# Patient Record
Sex: Female | Born: 1985 | Race: Black or African American | Hispanic: No | Marital: Single | Smoking: Never smoker
Health system: Southern US, Community
[De-identification: ages and names within clinical notes are randomized; demographics above are authoritative.]

## PROBLEM LIST (undated history)

## (undated) DIAGNOSIS — D649 Anemia, unspecified: Secondary | ICD-10-CM

## (undated) DIAGNOSIS — I10 Essential (primary) hypertension: Secondary | ICD-10-CM

## (undated) DIAGNOSIS — F419 Anxiety disorder, unspecified: Secondary | ICD-10-CM

## (undated) HISTORY — PX: APPENDECTOMY: SHX54

---

## 2006-01-05 ENCOUNTER — Emergency Department (HOSPITAL_COMMUNITY): Admission: EM | Admit: 2006-01-05 | Discharge: 2006-01-06 | Payer: Self-pay | Admitting: Emergency Medicine

## 2007-09-08 ENCOUNTER — Ambulatory Visit (HOSPITAL_COMMUNITY): Admission: RE | Admit: 2007-09-08 | Discharge: 2007-09-08 | Payer: Self-pay | Admitting: Obstetrics

## 2007-10-26 ENCOUNTER — Ambulatory Visit (HOSPITAL_COMMUNITY): Admission: RE | Admit: 2007-10-26 | Discharge: 2007-10-26 | Payer: Self-pay | Admitting: Obstetrics

## 2007-10-28 ENCOUNTER — Inpatient Hospital Stay (HOSPITAL_COMMUNITY): Admission: AD | Admit: 2007-10-28 | Discharge: 2007-10-28 | Payer: Self-pay | Admitting: Obstetrics & Gynecology

## 2007-12-14 ENCOUNTER — Inpatient Hospital Stay (HOSPITAL_COMMUNITY): Admission: AD | Admit: 2007-12-14 | Discharge: 2007-12-14 | Payer: Self-pay | Admitting: Obstetrics

## 2007-12-15 ENCOUNTER — Ambulatory Visit (HOSPITAL_COMMUNITY): Admission: RE | Admit: 2007-12-15 | Discharge: 2007-12-15 | Payer: Self-pay | Admitting: Obstetrics

## 2008-01-31 ENCOUNTER — Inpatient Hospital Stay (HOSPITAL_COMMUNITY): Admission: AD | Admit: 2008-01-31 | Discharge: 2008-01-31 | Payer: Self-pay | Admitting: Obstetrics & Gynecology

## 2008-02-01 ENCOUNTER — Inpatient Hospital Stay (HOSPITAL_COMMUNITY): Admission: AD | Admit: 2008-02-01 | Discharge: 2008-02-01 | Payer: Self-pay | Admitting: Obstetrics

## 2008-02-19 ENCOUNTER — Inpatient Hospital Stay (HOSPITAL_COMMUNITY): Admission: AD | Admit: 2008-02-19 | Discharge: 2008-02-19 | Payer: Self-pay | Admitting: Obstetrics

## 2008-03-02 ENCOUNTER — Inpatient Hospital Stay (HOSPITAL_COMMUNITY): Admission: AD | Admit: 2008-03-02 | Discharge: 2008-03-06 | Payer: Self-pay | Admitting: Obstetrics

## 2008-05-02 ENCOUNTER — Emergency Department (HOSPITAL_COMMUNITY): Admission: EM | Admit: 2008-05-02 | Discharge: 2008-05-02 | Payer: Self-pay | Admitting: Emergency Medicine

## 2008-05-03 ENCOUNTER — Ambulatory Visit (HOSPITAL_COMMUNITY): Admission: RE | Admit: 2008-05-03 | Discharge: 2008-05-03 | Payer: Self-pay | Admitting: Emergency Medicine

## 2008-05-07 ENCOUNTER — Emergency Department (HOSPITAL_COMMUNITY): Admission: EM | Admit: 2008-05-07 | Discharge: 2008-05-07 | Payer: Self-pay | Admitting: *Deleted

## 2009-05-01 ENCOUNTER — Emergency Department (HOSPITAL_COMMUNITY): Admission: EM | Admit: 2009-05-01 | Discharge: 2009-05-01 | Payer: Self-pay | Admitting: Emergency Medicine

## 2010-03-23 ENCOUNTER — Encounter: Payer: Self-pay | Admitting: Internal Medicine

## 2010-04-14 ENCOUNTER — Encounter: Payer: Self-pay | Admitting: Internal Medicine

## 2010-04-14 LAB — CONVERTED CEMR LAB
ALT: 9 units/L
AST: 18 units/L
Alkaline Phosphatase: 70 units/L
BUN: 8 mg/dL
Basophils Relative: 0 %
CO2: 24 meq/L
Chloride: 102 meq/L
Creatinine, Ser: 0.78 mg/dL
Eosinophils Relative: 3 %
Glucose, Bld: 72 mg/dL
HCT: 29.2 %
Lymphocytes, automated: 30 %
MCV: 72.3 fL
Monocytes Relative: 9 %
Platelets: 328 10*3/uL
Potassium: 3.4 meq/L
TSH: 1.16 microintl units/mL
Total Bilirubin: 0.2 mg/dL
Total Protein: 6.8 g/dL
WBC: 8.4 10*3/uL

## 2010-05-07 DIAGNOSIS — I10 Essential (primary) hypertension: Secondary | ICD-10-CM | POA: Insufficient documentation

## 2010-05-07 DIAGNOSIS — D649 Anemia, unspecified: Secondary | ICD-10-CM | POA: Insufficient documentation

## 2010-05-08 ENCOUNTER — Ambulatory Visit (INDEPENDENT_AMBULATORY_CARE_PROVIDER_SITE_OTHER): Payer: BC Managed Care – PPO | Admitting: Internal Medicine

## 2010-05-08 ENCOUNTER — Encounter: Payer: Self-pay | Admitting: Internal Medicine

## 2010-05-08 ENCOUNTER — Encounter (INDEPENDENT_AMBULATORY_CARE_PROVIDER_SITE_OTHER): Payer: Self-pay | Admitting: *Deleted

## 2010-05-08 DIAGNOSIS — R519 Headache, unspecified: Secondary | ICD-10-CM | POA: Insufficient documentation

## 2010-05-08 DIAGNOSIS — I10 Essential (primary) hypertension: Secondary | ICD-10-CM

## 2010-05-08 DIAGNOSIS — R51 Headache: Secondary | ICD-10-CM | POA: Insufficient documentation

## 2010-05-08 DIAGNOSIS — D649 Anemia, unspecified: Secondary | ICD-10-CM

## 2010-05-08 DIAGNOSIS — Z9189 Other specified personal risk factors, not elsewhere classified: Secondary | ICD-10-CM | POA: Insufficient documentation

## 2010-05-14 NOTE — Assessment & Plan Note (Signed)
Summary: NEW [employer] PT  #  PKG  STC   Vital Signs:  Patient profile:   25 year old female Height:      65 inches (165.10 cm) Weight:      146.4 pounds (66.55 kg) BMI:     24.45 O2 Sat:      98 % on Room air Temp:     99.0 degrees F (37.22 degrees C) oral Pulse rate:   78 / minute BP sitting:   140 / 100  (left arm) Cuff size:   regular  Vitals Entered By: Orlan Leavens RMA (May 08, 2010 9:42 AM)  O2 Flow:  Room air CC: New patient Is Patient Diabetic? No Pain Assessment Patient in pain? no        Primary Care Provider:  Newt Lukes MD  CC:  New patient.  History of Present Illness: new to me and our prcatice, here to est care  c/o uncontrolled HTN -  reports variable compliance with medical treatment due to fatigue and light headed symptoms; no rx'd changes in medication dose or frequency.  no edema, CP or HA - hx preeclampsia 2009 - still anticipating future preg but on depoprov now - BP exac by stress - better control when away from work - has tried to control bp with low Na diet and regular exercise -   anemia - felt to be iron defic due to heavy menses - takes prenatal mvi for same - no other bleeding or bruising  Preventive Screening-Counseling & Management  Alcohol-Tobacco     Smoking Status: never  Clinical Review Panels:  Prevention   Last Pap Smear:  Interpretation Result:Negative for intraepithelial Lesion or Malignancy.    (03/23/2010)  CBC   WBC:  8.4 (04/14/2010)   RBC:  4.04 (04/14/2010)   Hgb:  9.0 (04/14/2010)   Hct:  29.2 (04/14/2010)   Platelets:  328 (04/14/2010)   MCV  72.3 (04/14/2010)   RDW  15.5 (04/14/2010)   PMN:  57 (04/14/2010)   Monos:  9 (04/14/2010)   Eosinophils:  3 (04/14/2010)   Basophil:  0 (04/14/2010)  Complete Metabolic Panel   Glucose:  72 (04/14/2010)   Sodium:  135 (04/14/2010)   Potassium:  3.4 (04/14/2010)   Chloride:  102 (04/14/2010)   CO2:  24 (04/14/2010)   BUN:  8 (04/14/2010)   Creatinine:   0.78 (04/14/2010)   Albumin:  4.1 (04/14/2010)   Total Protein:  6.8 (04/14/2010)   Calcium:  9.0 (04/14/2010)   Total Bili:  0.2 (04/14/2010)   Alk Phos:  70 (04/14/2010)   SGPT (ALT):  9 (04/14/2010)   SGOT (AST):  18 (04/14/2010)   -  Date:  04/14/2010    WBC: 8.4    HGB: 9.0    HCT: 29.2    RBC: 4.04    PLT: 328    MCV: 72.3    RDW: 15.5    Neutrophil: 57    Lymphs: 30    Monos: 9    Eos: 3    Basophil: 0    BG Random: 72    BUN: 8    Creatinine: 0.78    Sodium: 135    Potassium: 3.4    Chloride: 102    CO2 Total: 24    SGOT (AST): 18    SGPT (ALT): 9    T. Bilirubin: 0.2    Alk Phos: 70    Calcium: 9.0    Total Protein: 6.8    Albumin:  4.1    TSH: 1.160  Current Medications (verified): 1)  Prenate Plus 27-1 Mg Tabs (Prenatal Vit-Fe Fumarate-Fa) .... Take 1 By Mouth Once Daily 2)  Carvedilol 6.25 Mg Tabs (Carvedilol) .... Take 1 Two Times A Day 3)  Hydrochlorothiazide 25 Mg Tabs (Hydrochlorothiazide) .... 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    Physical Exam  General:  alert, well-developed, well-nourished, and cooperative to examination.   fiance at side Eyes:  vision grossly  intact; pupils equal, round and reactive to light.  conjunctiva and lids normal.    Ears:  R ear normal and L ear normal.   Mouth:  teeth and gums in good repair; mucous membranes moist, without lesions or ulcers. oropharynx clear without exudate, no erythema.  Neck:  supple, full ROM, no masses, no thyromegaly; no thyroid nodules or tenderness. no JVD or carotid bruits.   Lungs:  normal respiratory effort, no intercostal retractions or use of accessory muscles; normal breath sounds bilaterally - no crackles and no wheezes.    Heart:  normal rate, regular rhythm, no murmur, and no rub. BLE without edema. normal DP pulses and normal cap refill in all 4 extremities    Abdomen:  soft, non-tender, normal bowel sounds, no distention; no masses and no appreciable hepatomegaly or splenomegaly.  no bruits Neurologic:  alert & oriented X3 and cranial nerves II-XII symetrically intact.  strength normal in all extremities, sensation intact to light touch, and gait normal. speech fluent without dysarthria or aphasia; follows commands with good comprehension.  Psych:  Oriented X3, memory intact for recent and remote, normally interactive, good eye contact, not anxious appearing, not depressed appearing, and not agitated.      Impression & Recommendations:  Problem # 1:  HYPERTENSION (ICD-401.9)  adjust current med dosing to control BP but avoid adv Se (fatigue,?overtx) - new erx done recent labs from gyn reviewed - norm lytes and Cr - reck 2-4 wk to review BP and titrate as needed encouraged to cont low Na and exercise for optimal weight and control of same The following medications were removed from the medication list:    Labetalol Hcl 100 Mg Tabs (Labetalol hcl) .Marland Kitchen... Take 1 two times a day Her updated medication list for this problem includes:    Carvedilol 3.125 Mg Tabs (Carvedilol) .Marland Kitchen... 1 by mouth two times a day    Hydrochlorothiazide 12.5 Mg Tabs (Hydrochlorothiazide) .Marland Kitchen... 1 by mouth once  daily  BP today: 140/100  Labs Reviewed: K+: 3.4 (04/14/2010) Creat: : 0.78 (04/14/2010)     Orders: Prescription Created Electronically [phone])  Problem # 2:  ANEMIA-NOS (ICD-285.9)  Hgb: 9.0 (04/14/2010)   Hct: 29.2 (04/14/2010)   Platelets: 328 (04/14/2010) RBC: 4.04 (04/14/2010)   RDW: 15.5 (04/14/2010)   WBC: 8.4 (04/14/2010) MCV: 72.3 (04/14/2010)  TSH: 1.160 (04/14/2010)  Complete Medication List: 1)  Prenate Plus 27-1 Mg Tabs (Prenatal vit-fe fumarate-fa) .... Take 1 by mouth once daily 2)  Carvedilol 3.125 Mg Tabs (Carvedilol) .Marland Kitchen.. 1 by mouth two times a day 3)  Hydrochlorothiazide 12.5 Mg Tabs (Hydrochlorothiazide) .Marland Kitchen.. 1 by mouth once daily 4)  Depo-provera 150 Mg/ml Susp (Medroxyprogesterone acetate) .... Im every 3 mo at gynecology office  Patient Instructions: 1)  it was good to see you today. 2)  medications and history and recent lab tests reviewed 3)  we will continue blood pressure medications but at lower dose than before - see list below - your prescriptions have been electronically submitted to your pharmacy. Please take as directed. Contact our office if you believe you're having problems with the medication(s).  4)  Please schedule a follow-up appointment in 2-4 weeks to recheck blood pressure and titrate medications as needed, call sooner if problems.  5)  work note provided - medically ok to return tomorrow 05/09/10 Prescriptions: HYDROCHLOROTHIAZIDE 12.5 MG TABS (HYDROCHLOROTHIAZIDE) 1 by mouth once daily  #30 x 1   Entered and Authorized by:   Newt Lukes MD   Signed by:   Newt Lukes MD on 05/08/2010   Method used:   Electronically to        [facility] [phone]* (retail)       [address]       [place], [place]  [address]       Ph: [phone]       Fax: [phone]   RxID:   [id number] CARVEDILOL 3.125 MG TABS (CARVEDILOL) 1 by mouth two times a day  #60 x 1   Entered and Authorized by:   Newt Lukes MD   Signed  by:   Newt Lukes MD on 05/08/2010   Method used:   Electronically to        [facility] [phone]* (retail)       [address]       [place], [place]  [address]       Ph: [phone]       Fax: [phone]   RxID:   [id number]    Orders Added: 1)  New Patient Level III [29937] 2)  Prescription Created Electronically [[id number]]     Pap Smear  Procedure date:  03/23/2010  Findings:      Interpretation Result:Negative for intraepithelial Lesion or Malignancy.

## 2010-05-14 NOTE — Letter (Signed)
Summary: [facility]  [address] [place], [place] [address]   Phone: [phone]  Fax: [phone]    Today's Date: May 08, 2010  Name of Patient: Felicia Gibson  The above named patient had a medical visit today 05/08/10 Please take this into consideration when reviewing the time away from work. May return back normal schedule tomorrow 05/09/10  Special Instructions:    Sincerely yours,   Dr. Rene Paci

## 2010-05-26 ENCOUNTER — Ambulatory Visit: Payer: BC Managed Care – PPO | Admitting: Internal Medicine

## 2010-05-26 DIAGNOSIS — Z0289 Encounter for other administrative examinations: Secondary | ICD-10-CM

## 2010-07-08 LAB — CBC
MCHC: 33.5 g/dL (ref 30.0–36.0)
RDW: 15.4 % (ref 11.5–15.5)
WBC: 6.2 10*3/uL (ref 4.0–10.5)

## 2010-07-08 LAB — COMPREHENSIVE METABOLIC PANEL
ALT: 9 U/L (ref 0–35)
Albumin: 3.6 g/dL (ref 3.5–5.2)
Creatinine, Ser: 0.74 mg/dL (ref 0.4–1.2)
Sodium: 141 mEq/L (ref 135–145)
Total Bilirubin: 0.5 mg/dL (ref 0.3–1.2)
Total Protein: 5.9 g/dL — ABNORMAL LOW (ref 6.0–8.3)

## 2010-07-08 LAB — URINALYSIS, ROUTINE W REFLEX MICROSCOPIC
Bilirubin Urine: NEGATIVE
Glucose, UA: NEGATIVE mg/dL
Ketones, ur: NEGATIVE mg/dL
Nitrite: NEGATIVE
Specific Gravity, Urine: 1.008 (ref 1.005–1.030)
pH: 7 (ref 5.0–8.0)

## 2010-07-08 LAB — DIFFERENTIAL
Eosinophils Absolute: 0.1 10*3/uL (ref 0.0–0.7)
Lymphocytes Relative: 24 % (ref 12–46)
Neutrophils Relative %: 66 % (ref 43–77)

## 2010-07-08 LAB — LIPASE, BLOOD: Lipase: 19 U/L (ref 11–59)

## 2010-08-10 IMAGING — US US FETAL BPP W/O NONSTRESS
1 series · 14 of 14 positions shown · non-contrast
Comparison: none

OBSTETRICAL ULTRASOUND:
 This ultrasound exam was performed in the [HOSPITAL] Ultrasound Department.  The OB US report was generated in the AS system, and faxed to the ordering physician.  This report is also available in [REDACTED] PACS.

[Series 1: us fetal bpp w/o nonstress · 0.28mm/px · 14 acquisitions, 14 frames shown]
[im 1/14]
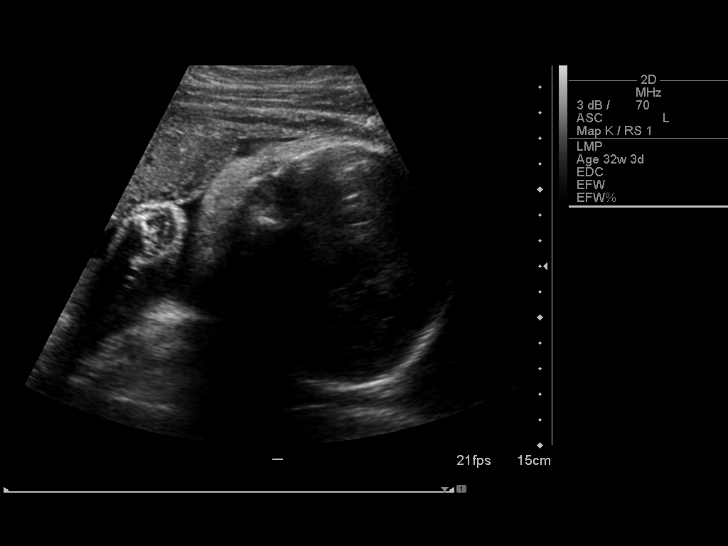
[im 2/14]
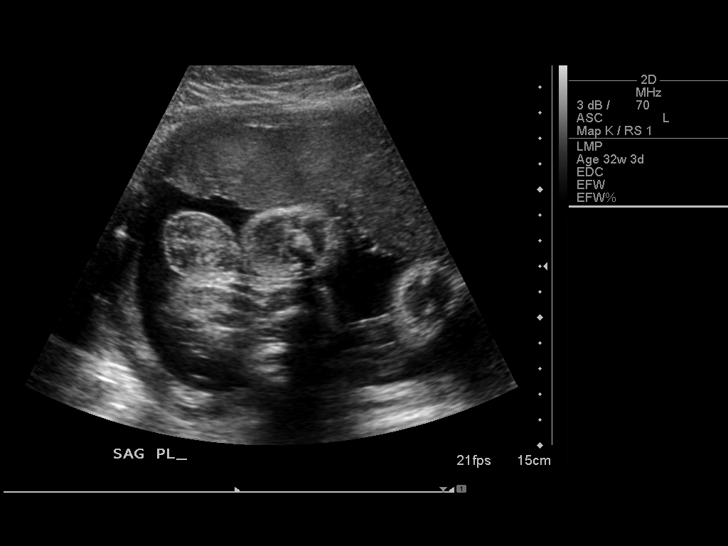
[im 3/14]
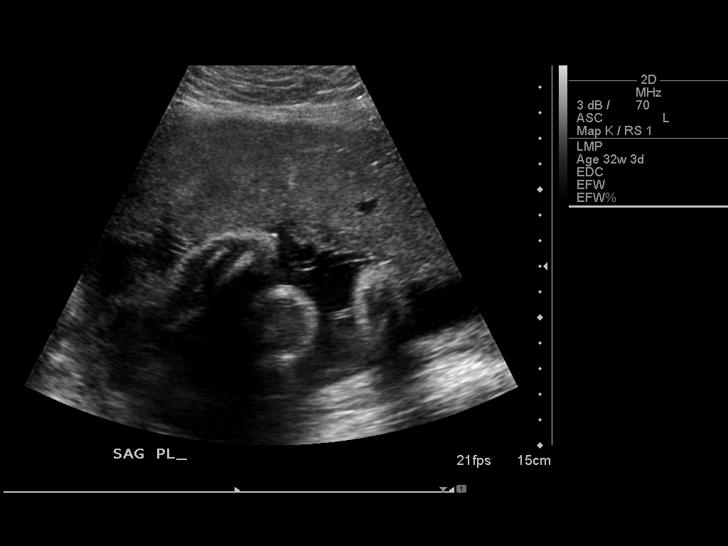
[im 4/14]
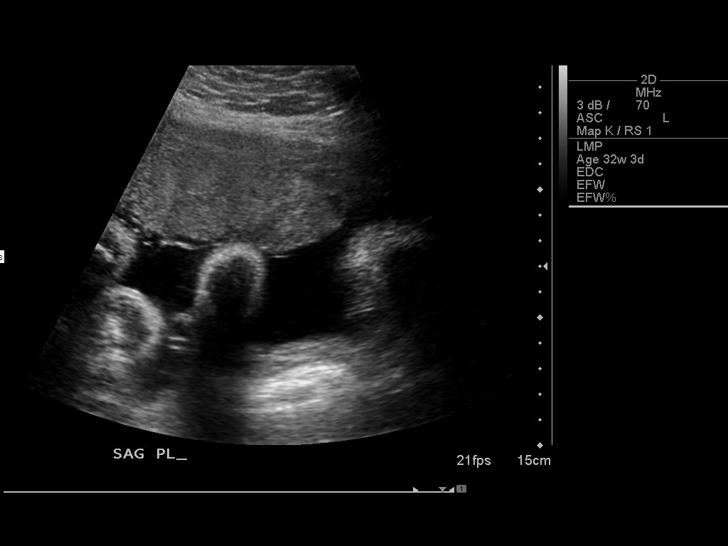
[im 5/14]
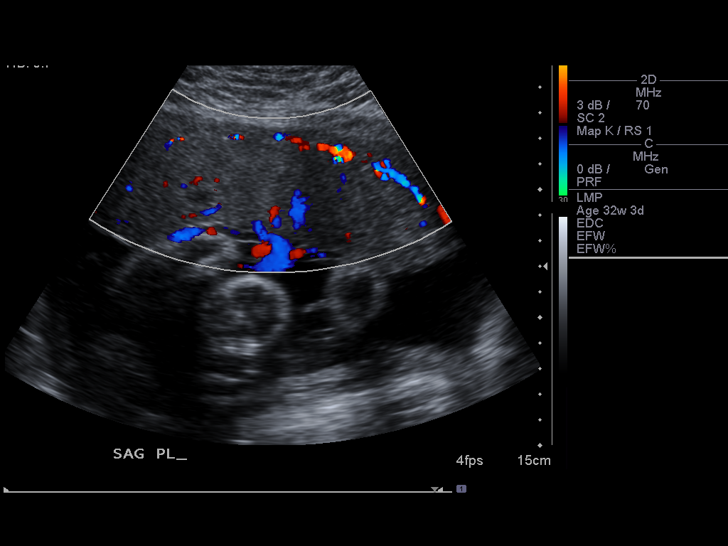
[im 6/14]
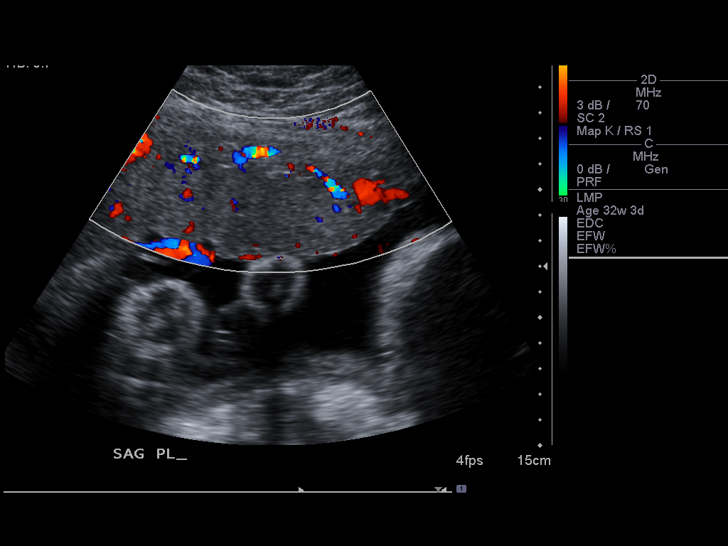
[im 7/14]
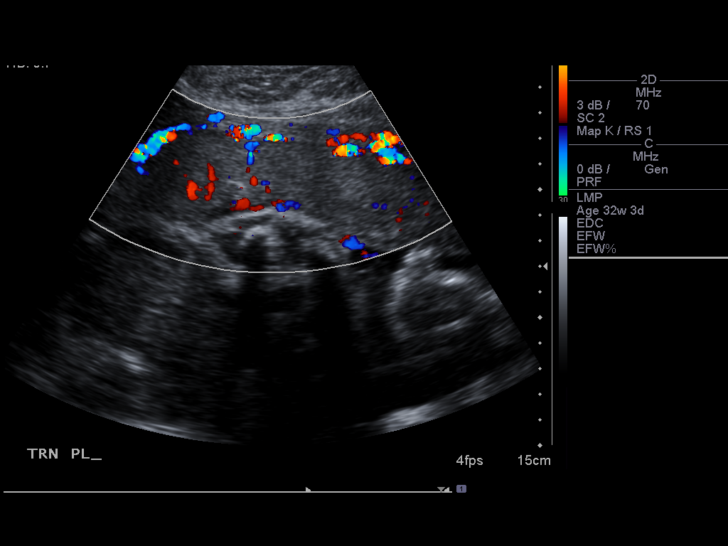
[im 8/14]
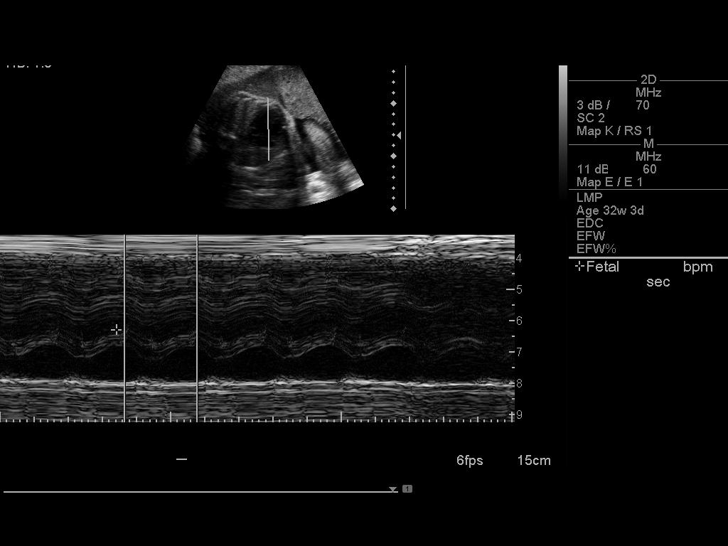
[im 9/14]
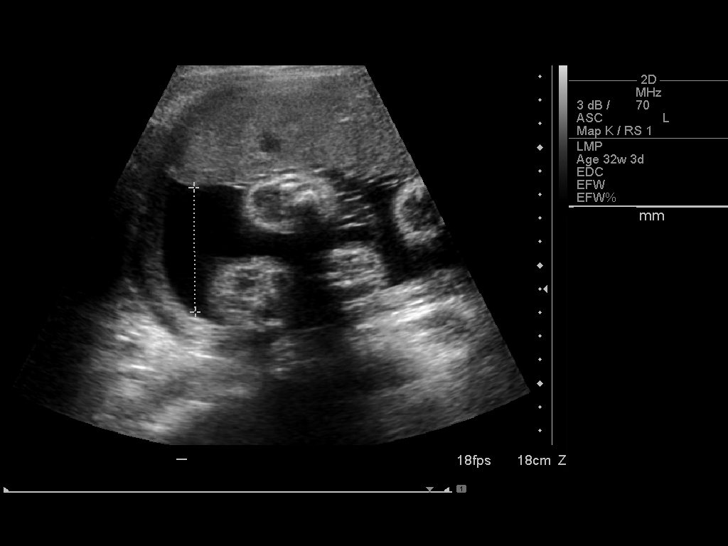
[im 10/14]
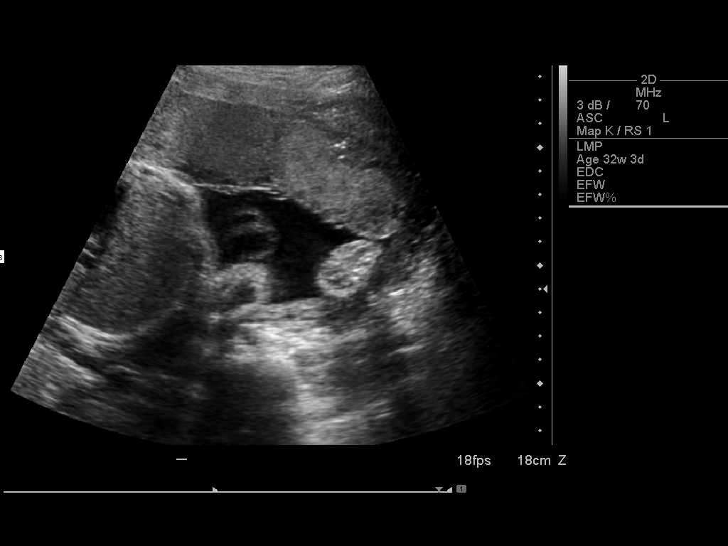
[im 11/14]
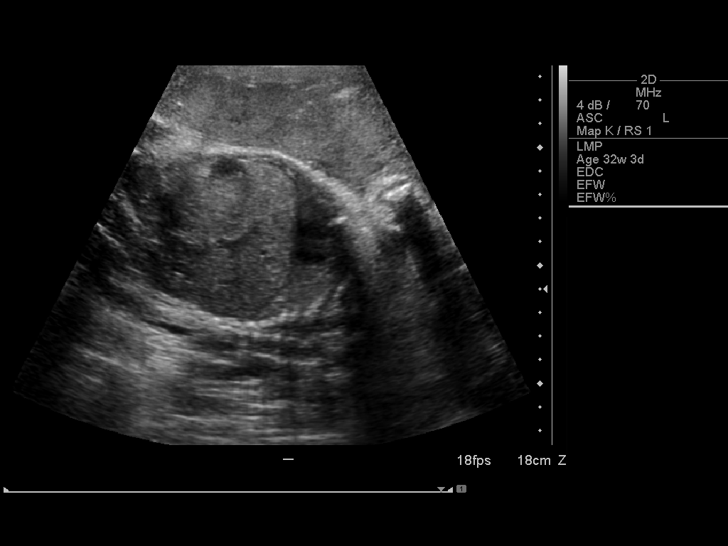
[im 12/14]
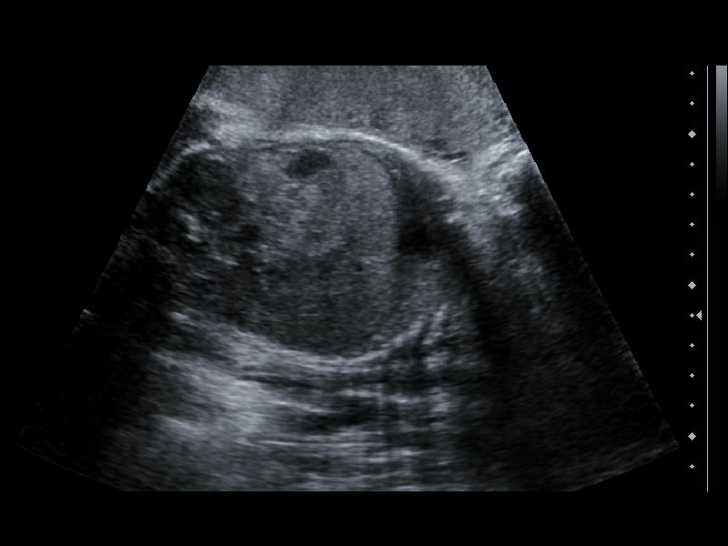
[im 13/14]
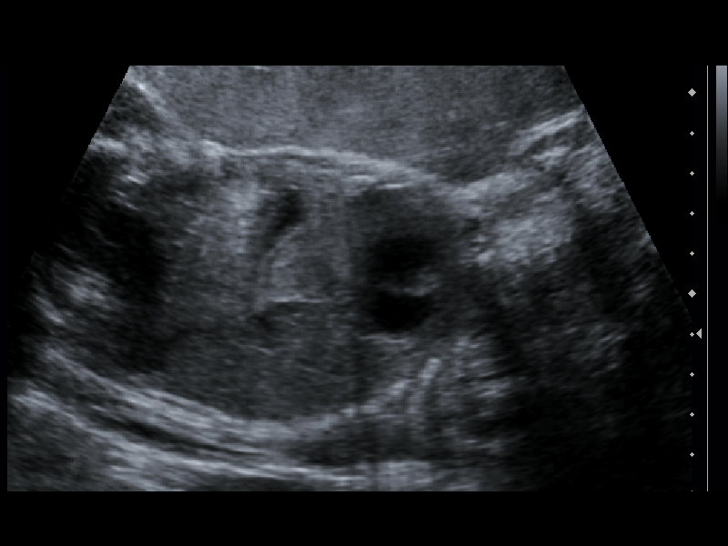
[im 14/14]
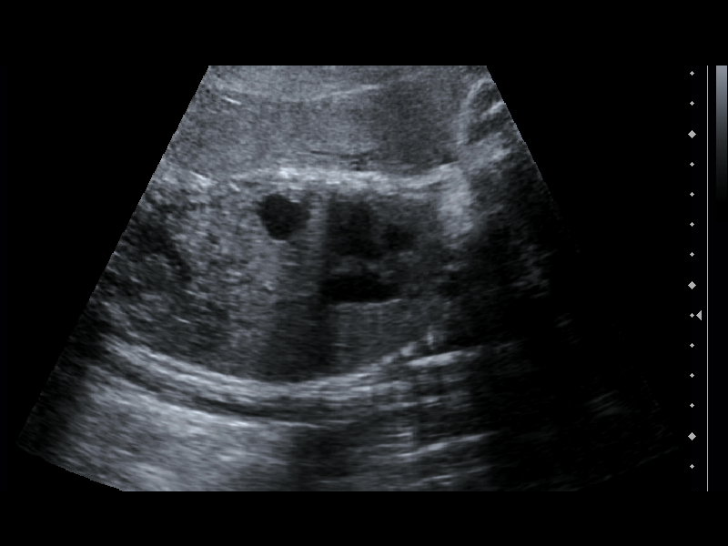

[14 of 14 positions shown; findings below may reference images not displayed]

IMPRESSION: See AS Obstetric US report.

## 2010-12-19 LAB — URINALYSIS, ROUTINE W REFLEX MICROSCOPIC
Bilirubin Urine: NEGATIVE
Hgb urine dipstick: NEGATIVE
Nitrite: NEGATIVE
Protein, ur: NEGATIVE
Urobilinogen, UA: 0.2
pH: 6

## 2010-12-19 LAB — WET PREP, GENITAL
Trich, Wet Prep: NONE SEEN
Yeast Wet Prep HPF POC: NONE SEEN

## 2010-12-19 LAB — CBC
MCV: 88.6
Platelets: 257
RDW: 13.3
WBC: 6.7

## 2010-12-19 LAB — ABO/RH: ABO/RH(D): O POS

## 2010-12-22 LAB — URINALYSIS, ROUTINE W REFLEX MICROSCOPIC
Ketones, ur: NEGATIVE
Protein, ur: NEGATIVE
pH: 7

## 2010-12-23 LAB — URINE MICROSCOPIC-ADD ON: RBC / HPF: NONE SEEN

## 2010-12-23 LAB — URINALYSIS, ROUTINE W REFLEX MICROSCOPIC
Glucose, UA: NEGATIVE
Protein, ur: NEGATIVE
Specific Gravity, Urine: <1.005 — ABNORMAL LOW
Urobilinogen, UA: 0.2

## 2010-12-26 LAB — URINALYSIS, ROUTINE W REFLEX MICROSCOPIC
Bilirubin Urine: NEGATIVE
Hgb urine dipstick: NEGATIVE
Ketones, ur: NEGATIVE mg/dL
Nitrite: NEGATIVE
Protein, ur: NEGATIVE mg/dL
Specific Gravity, Urine: <1.005 — ABNORMAL LOW (ref 1.005–1.030)
Urobilinogen, UA: 0.2 mg/dL (ref 0.0–1.0)

## 2010-12-26 LAB — URIC ACID: Uric Acid, Serum: 4.9 mg/dL (ref 2.4–7.0)

## 2010-12-26 LAB — COMPREHENSIVE METABOLIC PANEL
AST: 15 U/L (ref 0–37)
AST: 15 U/L (ref 0–37)
Albumin: 2.5 g/dL — ABNORMAL LOW (ref 3.5–5.2)
Albumin: 2.5 g/dL — ABNORMAL LOW (ref 3.5–5.2)
Alkaline Phosphatase: 116 U/L (ref 39–117)
Alkaline Phosphatase: 133 U/L — ABNORMAL HIGH (ref 39–117)
Alkaline Phosphatase: 95 U/L (ref 39–117)
BUN: 2 mg/dL — ABNORMAL LOW (ref 6–23)
BUN: 2 mg/dL — ABNORMAL LOW (ref 6–23)
CO2: 24 mEq/L (ref 19–32)
CO2: 25 mEq/L (ref 19–32)
Calcium: 8.4 mg/dL (ref 8.4–10.5)
Chloride: 106 mEq/L (ref 96–112)
Chloride: 107 mEq/L (ref 96–112)
Creatinine, Ser: 0.41 mg/dL (ref 0.4–1.2)
GFR calc Af Amer: >60 mL/min (ref >60)
GFR calc Af Amer: >60 mL/min (ref >60)
GFR calc Af Amer: >60 mL/min (ref >60)
GFR calc non Af Amer: >60 mL/min (ref >60)
GFR calc non Af Amer: >60 mL/min (ref >60)
Glucose, Bld: 81 mg/dL (ref 70–99)
Potassium: 3.4 mEq/L — ABNORMAL LOW (ref 3.5–5.1)
Potassium: 3.7 mEq/L (ref 3.5–5.1)
Potassium: 3.8 mEq/L (ref 3.5–5.1)
Sodium: 135 mEq/L (ref 135–145)
Sodium: 136 mEq/L (ref 135–145)
Total Bilirubin: 0.4 mg/dL (ref 0.3–1.2)
Total Bilirubin: 0.4 mg/dL (ref 0.3–1.2)
Total Protein: 4.2 g/dL — ABNORMAL LOW (ref 6.0–8.3)
Total Protein: 5.6 g/dL — ABNORMAL LOW (ref 6.0–8.3)
Total Protein: 5.7 g/dL — ABNORMAL LOW (ref 6.0–8.3)

## 2010-12-26 LAB — LACTATE DEHYDROGENASE
LDH: 139 U/L (ref 94–250)
LDH: 244 U/L (ref 94–250)

## 2010-12-26 LAB — CBC
HCT: 23.6 % — ABNORMAL LOW (ref 36.0–46.0)
HCT: 28.2 % — ABNORMAL LOW (ref 36.0–46.0)
HCT: 29.5 % — ABNORMAL LOW (ref 36.0–46.0)
Hemoglobin: 8.1 g/dL — ABNORMAL LOW (ref 12.0–15.0)
MCHC: 33.6 g/dL (ref 30.0–36.0)
MCHC: 33.7 g/dL (ref 30.0–36.0)
MCV: 84.7 fL (ref 78.0–100.0)
MCV: 84.9 fL (ref 78.0–100.0)
Platelets: 238 10*3/uL (ref 150–400)
Platelets: 248 10*3/uL (ref 150–400)
Platelets: 267 10*3/uL (ref 150–400)
RBC: 3.32 MIL/uL — ABNORMAL LOW (ref 3.87–5.11)
RBC: 3.51 MIL/uL — ABNORMAL LOW (ref 3.87–5.11)
RDW: 13.1 % (ref 11.5–15.5)
RDW: 13.3 % (ref 11.5–15.5)
RDW: 13.4 % (ref 11.5–15.5)
RDW: 13.6 % (ref 11.5–15.5)
WBC: 6.8 10*3/uL (ref 4.0–10.5)

## 2010-12-26 LAB — MAGNESIUM: Magnesium: 5.9 mg/dL — ABNORMAL HIGH (ref 1.5–2.5)

## 2010-12-26 LAB — RPR: RPR Ser Ql: NONREACTIVE

## 2014-12-25 ENCOUNTER — Emergency Department (HOSPITAL_COMMUNITY): Payer: Self-pay

## 2014-12-25 ENCOUNTER — Encounter (HOSPITAL_COMMUNITY): Payer: Self-pay | Admitting: Emergency Medicine

## 2014-12-25 ENCOUNTER — Emergency Department (HOSPITAL_COMMUNITY)
Admission: EM | Admit: 2014-12-25 | Discharge: 2014-12-25 | Disposition: A | Discharge status: Home / Self Care | Payer: Self-pay | Attending: Emergency Medicine | Admitting: Emergency Medicine

## 2014-12-25 DIAGNOSIS — Z3202 Encounter for pregnancy test, result negative: Secondary | ICD-10-CM | POA: Insufficient documentation

## 2014-12-25 DIAGNOSIS — I10 Essential (primary) hypertension: Secondary | ICD-10-CM | POA: Insufficient documentation

## 2014-12-25 DIAGNOSIS — Z8659 Personal history of other mental and behavioral disorders: Secondary | ICD-10-CM | POA: Insufficient documentation

## 2014-12-25 DIAGNOSIS — M79605 Pain in left leg: Secondary | ICD-10-CM | POA: Insufficient documentation

## 2014-12-25 DIAGNOSIS — Z862 Personal history of diseases of the blood and blood-forming organs and certain disorders involving the immune mechanism: Secondary | ICD-10-CM | POA: Insufficient documentation

## 2014-12-25 DIAGNOSIS — Z79899 Other long term (current) drug therapy: Secondary | ICD-10-CM | POA: Insufficient documentation

## 2014-12-25 DIAGNOSIS — R0789 Other chest pain: Secondary | ICD-10-CM

## 2014-12-25 DIAGNOSIS — R0602 Shortness of breath: Secondary | ICD-10-CM | POA: Insufficient documentation

## 2014-12-25 HISTORY — DX: Anxiety disorder, unspecified: F41.9

## 2014-12-25 HISTORY — DX: Essential (primary) hypertension: I10

## 2014-12-25 HISTORY — DX: Anemia, unspecified: D64.9

## 2014-12-25 LAB — BASIC METABOLIC PANEL
Anion gap: 5 (ref 5–15)
BUN: 11 mg/dL (ref 6–20)
CALCIUM: 9 mg/dL (ref 8.9–10.3)
CO2: 28 mmol/L (ref 22–32)
CREATININE: 0.76 mg/dL (ref 0.44–1.00)
Chloride: 101 mmol/L (ref 101–111)
GFR calc non Af Amer: >60 mL/min (ref >60)
Glucose, Bld: 94 mg/dL (ref 65–99)
Potassium: 3.7 mmol/L (ref 3.5–5.1)
SODIUM: 134 mmol/L — AB (ref 135–145)

## 2014-12-25 LAB — I-STAT TROPONIN, ED: Troponin i, poc: 0.01 ng/mL (ref 0.00–0.08)

## 2014-12-25 LAB — CBC
HCT: 36.2 % (ref 36.0–46.0)
Hemoglobin: 12.2 g/dL (ref 12.0–15.0)
MCH: 28.4 pg (ref 26.0–34.0)
MCHC: 33.7 g/dL (ref 30.0–36.0)
MCV: 84.2 fL (ref 78.0–100.0)
PLATELETS: 340 10*3/uL (ref 150–400)
RBC: 4.3 MIL/uL (ref 3.87–5.11)
RDW: 13.7 % (ref 11.5–15.5)
WBC: 6.9 10*3/uL (ref 4.0–10.5)

## 2014-12-25 LAB — I-STAT BETA HCG BLOOD, ED (MC, WL, AP ONLY)

## 2014-12-25 LAB — D-DIMER, QUANTITATIVE: D-Dimer, Quant: <0.27 ug{FEU}/mL (ref 0.00–0.48)

## 2014-12-25 MED ORDER — LORAZEPAM 2 MG/ML IJ SOLN
1.0000 mg | Freq: Once | INTRAMUSCULAR | Status: AC
  Administered 2014-12-25: 1 mg via INTRAMUSCULAR
  Filled 2014-12-25: qty 1

## 2014-12-25 MED ORDER — LORAZEPAM 2 MG/ML IJ SOLN
1.0000 mg | Freq: Once | INTRAMUSCULAR | Status: DC
Start: 2014-12-25 — End: 2014-12-25

## 2014-12-25 MED ORDER — IBUPROFEN 400 MG PO TABS: 400.0000 mg | ORAL_TABLET | Freq: Four times a day (QID) | ORAL | Status: DC | PRN

## 2014-12-25 MED ORDER — KETOROLAC TROMETHAMINE 30 MG/ML IJ SOLN: 30.0000 mg | Freq: Once | INTRAMUSCULAR | Status: DC

## 2014-12-25 MED ORDER — DIAZEPAM 2 MG PO TABS: 2.0000 mg | ORAL_TABLET | ORAL | Status: DC | PRN

## 2014-12-25 MED ORDER — KETOROLAC TROMETHAMINE 30 MG/ML IJ SOLN
30.0000 mg | Freq: Once | INTRAMUSCULAR | Status: AC
  Administered 2014-12-25: 30 mg via INTRAMUSCULAR
  Filled 2014-12-25: qty 1

## 2014-12-25 NOTE — ED Notes (Signed)
Pt c/o posterior left calf pain this past week radiating to left posterior thigh. Starting today has had mid sternal chest pain radiating to left shoulder and left neck. C/o SOB and continues to say, "My chest hurts so bad, it's just the weirdest pain." RR even/unlabored. Not on blood thinners. Denies hx pulmonary embolism. No other c/c. Denies N/V/D.

## 2014-12-25 NOTE — ED Provider Notes (Signed)
CSN: [id number]     Arrival date & time 12/25/14  1408 History   First MD Initiated Contact with Patient 12/25/14 1507     Chief Complaint  Patient presents with  . Chest Pain  . Shortness of Breath  . Leg Pain     (Consider location/radiation/quality/duration/timing/severity/associated sxs/prior Treatment) HPI Comments: Pt here with 3 days of left leg pain described as sharp and worse at the sciatic notch--pain worse with standing and does radiate down her leg--denies leg swelling  Also, notes sudden onset of left sided sharp chest pain pain that's been constant and worse with movement or deep breathing-- Denies fever, cough, trauma--no syncope or near syncope, no prior h/o same Pt given zofran and transported by ems  Patient is a 29 y.o. female presenting with chest pain, shortness of breath, and leg pain. The history is provided by the patient.  Chest Pain Associated symptoms: shortness of breath   Shortness of Breath Associated symptoms: chest pain   Leg Pain   Past Medical History  Diagnosis Date  . Hypertension   . Anemia   . Anxiety    Past Surgical History  Procedure Laterality Date  . Appendectomy     History reviewed. No pertinent family history. Social History  Substance Use Topics  . Smoking status: Never Smoker   . Smokeless tobacco: None  . Alcohol Use: No   OB History    No data available     Review of Systems  Respiratory: Positive for shortness of breath.   Cardiovascular: Positive for chest pain.  All other systems reviewed and are negative.     Allergies  Review of patient's allergies indicates no known allergies.  Home Medications   Prior to Admission medications   Medication Sig Start Date End Date Taking? Authorizing Provider  acetaminophen (TYLENOL) 500 MG tablet Take 1,000 mg by mouth every 6 (six) hours as needed for mild pain, moderate pain, fever or headache.   Yes Historical Provider, MD  carvedilol (COREG) 3.125 MG tablet  Take 3.125 mg by mouth 2 (two) times daily with a meal.   Yes Historical Provider, MD  hydrochlorothiazide (MICROZIDE) 12.5 MG capsule Take 12.5 mg by mouth daily.   Yes Historical Provider, MD   BP 133/104 mmHg  Pulse 75  Temp(Src) 98.5 F (36.9 C) (Oral)  Resp 12  SpO2 100%  LMP 12/11/2014 (Approximate) Physical Exam  Constitutional: She is oriented to person, place, and time. She appears well-developed and well-nourished.  Non-toxic appearance. No distress.  HENT:  Head: Normocephalic and atraumatic.  Eyes: Conjunctivae, EOM and lids are normal. Pupils are equal, round, and reactive to light.  Neck: Normal range of motion. Neck supple. No tracheal deviation present. No thyroid mass present.  Cardiovascular: Normal rate, regular rhythm and normal heart sounds.  Exam reveals no gallop.   No murmur heard. Pulmonary/Chest: Effort normal and breath sounds normal. No stridor. No respiratory distress. She has no decreased breath sounds. She has no wheezes. She has no rhonchi. She has no rales.  Abdominal: Soft. Normal appearance and bowel sounds are normal. She exhibits no distension. There is no tenderness. There is no rebound and no CVA tenderness.  Musculoskeletal: Normal range of motion. She exhibits no edema or tenderness.       Legs: Neurological: She is alert and oriented to person, place, and time. She has normal strength. No cranial nerve deficit or sensory deficit. GCS eye subscore is 4. GCS verbal subscore is 5. GCS motor subscore  is 6.  Skin: Skin is warm and dry. No abrasion and no rash noted.  Psychiatric: She has a normal mood and affect. Her speech is normal and behavior is normal.  Nursing note and vitals reviewed.   ED Course  Procedures (including critical care time) Labs Review Labs Reviewed  BASIC METABOLIC PANEL - Abnormal; Notable for the following:    Sodium 134 (*)    All other components within normal limits  CBC  D-DIMER, QUANTITATIVE (NOT AT [facility])  I-STAT  BETA HCG BLOOD, ED ([facility], [facility], [facility] ONLY)  I-STAT TROPOININ, ED    Imaging Review Dg Chest 2 View  12/25/2014   CLINICAL DATA:  Chest pain and shortness of breath today.  EXAM: CHEST  2 VIEW  COMPARISON:  None.  FINDINGS: The heart size and mediastinal contours are within normal limits. Both lungs are clear. The visualized skeletal structures are unremarkable.  IMPRESSION: No active cardiopulmonary disease.   Electronically Signed   By: Sherian Rein M.D.   On: 12/25/2014 14:46   I have personally reviewed and evaluated these images and lab results as part of my medical decision-making.   EKG Interpretation   Date/Time:  Tuesday December 25 2014 14:18:48 EDT Ventricular Rate:  70 PR Interval:  152 QRS Duration: 90 QT Interval:  403 QTC Calculation: 435 R Axis:   35 Text Interpretation:  Sinus rhythm Confirmed by Freida Busman  MD, Gaila Engebretsen ([id number])  on 12/25/2014 3:27:23 PM      MDM   Final diagnoses:  None   Patient given medications here musculoskeletal chest pain as well as likely sciatica feels better. No evidence of stroke at this time. Do not think this represents ACS or PE. Patient for discharge     Lorre Nick, MD 12/25/14 1739

## 2014-12-25 NOTE — ED Notes (Signed)
Per EMS-states patient was at work, eating lunch when she got central chest pain radiating to left arm, Zofran 4 mg given in route

## 2014-12-25 NOTE — ED Notes (Signed)
Pt c/o PIV "hurting her to the point my arm feels like something is wrong with it." PIV flushed with 10 cc NS. Appropriate blood return noted without tourniquet. PIV removed.

## 2014-12-25 NOTE — Discharge Instructions (Signed)
Sciatica °Sciatica is pain, weakness, numbness, or tingling along the path of the sciatic nerve. The nerve starts in the lower back and runs down the back of each leg. The nerve controls the muscles in the lower leg and in the back of the knee, while also providing sensation to the back of the thigh, lower leg, and the sole of your foot. Sciatica is a symptom of another medical condition. For instance, nerve damage or certain conditions, such as a herniated disk or bone spur on the spine, pinch or put pressure on the sciatic nerve. This causes the pain, weakness, or other sensations normally associated with sciatica. Generally, sciatica only affects one side of the body. °CAUSES  °· Herniated or slipped disc. °· Degenerative disk disease. °· A pain disorder involving the narrow muscle in the buttocks (piriformis syndrome). °· Pelvic injury or fracture. °· Pregnancy. °· Tumor (rare). °SYMPTOMS  °Symptoms can vary from mild to very severe. The symptoms usually travel from the low back to the buttocks and down the back of the leg. Symptoms can include: °· Mild tingling or dull aches in the lower back, leg, or hip. °· Numbness in the back of the calf or sole of the foot. °· Burning sensations in the lower back, leg, or hip. °· Sharp pains in the lower back, leg, or hip. °· Leg weakness. °· Severe back pain inhibiting movement. °These symptoms may get worse with coughing, sneezing, laughing, or prolonged sitting or standing. Also, being overweight may worsen symptoms. °DIAGNOSIS  °Your caregiver will perform a physical exam to look for common symptoms of sciatica. He or she may ask you to do certain movements or activities that would trigger sciatic nerve pain. Other tests may be performed to find the cause of the sciatica. These may include: °· Blood tests. °· X-rays. °· Imaging tests, such as an MRI or CT scan. °TREATMENT  °Treatment is directed at the cause of the sciatic pain. Sometimes, treatment is not necessary  and the pain and discomfort goes away on its own. If treatment is needed, your caregiver may suggest: °· Over-the-counter medicines to relieve pain. °· Prescription medicines, such as anti-inflammatory medicine, muscle relaxants, or narcotics. °· Applying heat or ice to the painful area. °· Steroid injections to lessen pain, irritation, and inflammation around the nerve. °· Reducing activity during periods of pain. °· Exercising and stretching to strengthen your abdomen and improve flexibility of your spine. Your caregiver may suggest losing weight if the extra weight makes the back pain worse. °· Physical therapy. °· Surgery to eliminate what is pressing or pinching the nerve, such as a bone spur or part of a herniated disk. °HOME CARE INSTRUCTIONS  °· Only take over-the-counter or prescription medicines for pain or discomfort as directed by your caregiver. °· Apply ice to the affected area for 20 minutes, 3-4 times a day for the first 48-72 hours. Then try heat in the same way. °· Exercise, stretch, or perform your usual activities if these do not aggravate your pain. °· Attend physical therapy sessions as directed by your caregiver. °· Keep all follow-up appointments as directed by your caregiver. °· Do not wear high heels or shoes that do not provide proper support. °· Check your mattress to see if it is too soft. A firm mattress may lessen your pain and discomfort. °SEEK IMMEDIATE MEDICAL CARE IF:  °· You lose control of your bowel or bladder (incontinence). °· You have increasing weakness in the lower back, pelvis, buttocks,   or legs.  You have redness or swelling of your back.  You have a burning sensation when you urinate.  You have pain that gets worse when you lie down or awakens you at night.  Your pain is worse than you have experienced in the past.  Your pain is lasting longer than 4 weeks.  You are suddenly losing weight without reason. MAKE SURE YOU:  Understand these  instructions.  Will watch your condition.  Will get help right away if you are not doing well or get worse. Document Released: 03/03/2001 Document Revised: 09/08/2011 Document Reviewed: 07/19/2011 [facility] Patient Information 2015 [place], [place]. This information is not intended to replace advice given to you by your health care provider. Make sure you discuss any questions you have with your health care provider. Nonspecific Chest Pain  Chest pain can be caused by many different conditions. There is always a chance that your pain could be related to something serious, such as a heart attack or a blood clot in your lungs. Chest pain can also be caused by conditions that are not life-threatening. If you have chest pain, it is very important to follow up with your health care provider. CAUSES  Chest pain can be caused by:  Heartburn.  Pneumonia or bronchitis.  Anxiety or stress.  Inflammation around your heart (pericarditis) or lung (pleuritis or pleurisy).  A blood clot in your lung.  A collapsed lung (pneumothorax). It can develop suddenly on its own (spontaneous pneumothorax) or from trauma to the chest.  Shingles infection (varicella-zoster virus).  Heart attack.  Damage to the bones, muscles, and cartilage that make up your chest wall. This can include:  Bruised bones due to injury.  Strained muscles or cartilage due to frequent or repeated coughing or overwork.  Fracture to one or more ribs.  Sore cartilage due to inflammation (costochondritis). RISK FACTORS  Risk factors for chest pain may include:  Activities that increase your risk for trauma or injury to your chest.  Respiratory infections or conditions that cause frequent coughing.  Medical conditions or overeating that can cause heartburn.  Heart disease or family history of heart disease.  Conditions or health behaviors that increase your risk of developing a blood clot.  Having had chicken pox (varicella  zoster). SIGNS AND SYMPTOMS Chest pain can feel like:  Burning or tingling on the surface of your chest or deep in your chest.  Crushing, pressure, aching, or squeezing pain.  Dull or sharp pain that is worse when you move, cough, or take a deep breath.  Pain that is also felt in your back, neck, shoulder, or arm, or pain that spreads to any of these areas. Your chest pain may come and go, or it may stay constant. DIAGNOSIS Lab tests or other studies may be needed to find the cause of your pain. Your health care provider may have you take a test called an ambulatory ECG (electrocardiogram). An ECG records your heartbeat patterns at the time the test is performed. You may also have other tests, such as:  Transthoracic echocardiogram (TTE). During echocardiography, sound waves are used to create a picture of all of the heart structures and to look at how blood flows through your heart.  Transesophageal echocardiogram (TEE).This is a more advanced imaging test that obtains images from inside your body. It allows your health care provider to see your heart in finer detail.  Cardiac monitoring. This allows your health care provider to monitor your heart rate and rhythm  in real time.  Holter monitor. This is a portable device that records your heartbeat and can help to diagnose abnormal heartbeats. It allows your health care provider to track your heart activity for several days, if needed.  Stress tests. These can be done through exercise or by taking medicine that makes your heart beat more quickly.  Blood tests.  Imaging tests. TREATMENT  Your treatment depends on what is causing your chest pain. Treatment may include:  Medicines. These may include:  Acid blockers for heartburn.  Anti-inflammatory medicine.  Pain medicine for inflammatory conditions.  Antibiotic medicine, if an infection is present.  Medicines to dissolve blood clots.  Medicines to treat coronary artery  disease.  Supportive care for conditions that do not require medicines. This may include:  Resting.  Applying heat or cold packs to injured areas.  Limiting activities until pain decreases. HOME CARE INSTRUCTIONS  If you were prescribed an antibiotic medicine, finish it all even if you start to feel better.  Avoid any activities that bring on chest pain.  Do not use any tobacco products, including cigarettes, chewing tobacco, or electronic cigarettes. If you need help quitting, ask your health care provider.  Do not drink alcohol.  Take medicines only as directed by your health care provider.  Keep all follow-up visits as directed by your health care provider. This is important. This includes any further testing if your chest pain does not go away.  If heartburn is the cause for your chest pain, you may be told to keep your head raised (elevated) while sleeping. This reduces the chance that acid will go from your stomach into your esophagus.  Make lifestyle changes as directed by your health care provider. These may include:  Getting regular exercise. Ask your health care provider to suggest some activities that are safe for you.  Eating a heart-healthy diet. A registered dietitian can help you to learn healthy eating options.  Maintaining a healthy weight.  Managing diabetes, if necessary.  Reducing stress. SEEK MEDICAL CARE IF:  Your chest pain does not go away after treatment.  You have a rash with blisters on your chest.  You have a fever. SEEK IMMEDIATE MEDICAL CARE IF:   Your chest pain is worse.  You have an increasing cough, or you cough up blood.  You have severe abdominal pain.  You have severe weakness.  You faint.  You have chills.  You have sudden, unexplained chest discomfort.  You have sudden, unexplained discomfort in your arms, back, neck, or jaw.  You have shortness of breath at any time.  You suddenly start to sweat, or your skin gets  clammy.  You feel nauseous or you vomit.  You suddenly feel light-headed or dizzy.  Your heart begins to beat quickly, or it feels like it is skipping beats. These symptoms may represent a serious problem that is an emergency. Do not wait to see if the symptoms will go away. Get medical help right away. Call your local emergency services (911 in the [place] Do not drive yourself to the hospital.   This information is not intended to replace advice given to you by your health care provider. Make sure you discuss any questions you have with your health care provider.   Document Released: 12/17/2004 Document Revised: 03/30/2014 Document Reviewed: 10/13/2013 Elsevier Interactive Patient Education [employer]

## 2015-02-20 ENCOUNTER — Emergency Department (HOSPITAL_BASED_OUTPATIENT_CLINIC_OR_DEPARTMENT_OTHER): Payer: Self-pay

## 2015-02-20 ENCOUNTER — Emergency Department (HOSPITAL_BASED_OUTPATIENT_CLINIC_OR_DEPARTMENT_OTHER)
Admission: EM | Admit: 2015-02-20 | Discharge: 2015-02-20 | Disposition: A | Discharge status: Home / Self Care | Payer: Self-pay | Attending: Emergency Medicine | Admitting: Emergency Medicine

## 2015-02-20 ENCOUNTER — Encounter (HOSPITAL_BASED_OUTPATIENT_CLINIC_OR_DEPARTMENT_OTHER): Payer: Self-pay | Admitting: *Deleted

## 2015-02-20 DIAGNOSIS — B9789 Other viral agents as the cause of diseases classified elsewhere: Secondary | ICD-10-CM

## 2015-02-20 DIAGNOSIS — Z79899 Other long term (current) drug therapy: Secondary | ICD-10-CM | POA: Insufficient documentation

## 2015-02-20 DIAGNOSIS — F419 Anxiety disorder, unspecified: Secondary | ICD-10-CM | POA: Insufficient documentation

## 2015-02-20 DIAGNOSIS — N898 Other specified noninflammatory disorders of vagina: Secondary | ICD-10-CM | POA: Insufficient documentation

## 2015-02-20 DIAGNOSIS — Z862 Personal history of diseases of the blood and blood-forming organs and certain disorders involving the immune mechanism: Secondary | ICD-10-CM | POA: Insufficient documentation

## 2015-02-20 DIAGNOSIS — M791 Myalgia: Secondary | ICD-10-CM | POA: Insufficient documentation

## 2015-02-20 DIAGNOSIS — J069 Acute upper respiratory infection, unspecified: Secondary | ICD-10-CM | POA: Insufficient documentation

## 2015-02-20 DIAGNOSIS — I10 Essential (primary) hypertension: Secondary | ICD-10-CM | POA: Insufficient documentation

## 2015-02-20 DIAGNOSIS — Z3202 Encounter for pregnancy test, result negative: Secondary | ICD-10-CM | POA: Insufficient documentation

## 2015-02-20 LAB — URINALYSIS, ROUTINE W REFLEX MICROSCOPIC
Bilirubin Urine: NEGATIVE
Glucose, UA: NEGATIVE mg/dL
HGB URINE DIPSTICK: NEGATIVE
KETONES UR: NEGATIVE mg/dL
Leukocytes, UA: NEGATIVE
NITRITE: NEGATIVE
PROTEIN: NEGATIVE mg/dL
SPECIFIC GRAVITY, URINE: 1.026 (ref 1.005–1.030)
pH: 6.5 (ref 5.0–8.0)

## 2015-02-20 LAB — PREGNANCY, URINE: PREG TEST UR: NEGATIVE

## 2015-02-20 MED ORDER — GUAIFENESIN 100 MG/5ML PO LIQD: 100.0000 mg | ORAL | Status: DC | PRN

## 2015-02-20 NOTE — Discharge Instructions (Signed)
Upper Respiratory Infection, Adult Most upper respiratory infections (URIs) are a viral infection of the air passages leading to the lungs. A URI affects the nose, throat, and upper air passages. The most common type of URI is nasopharyngitis and is typically referred to as "the common cold." URIs run their course and usually go away on their own. Most of the time, a URI does not require medical attention, but sometimes a bacterial infection in the upper airways can follow a viral infection. This is called a secondary infection. Sinus and middle ear infections are common types of secondary upper respiratory infections. Bacterial pneumonia can also complicate a URI. A URI can worsen asthma and chronic obstructive pulmonary disease (COPD). Sometimes, these complications can require emergency medical care and may be life threatening.  CAUSES Almost all URIs are caused by viruses. A virus is a type of germ and can spread from one person to another.  RISKS FACTORS You may be at risk for a URI if:   You smoke.   You have chronic heart or lung disease.  You have a weakened defense (immune) system.   You are very young or very old.   You have nasal allergies or asthma.  You work in crowded or poorly ventilated areas.  You work in health care facilities or schools. SIGNS AND SYMPTOMS  Symptoms typically develop 2-3 days after you come in contact with a cold virus. Most viral URIs last 7-10 days. However, viral URIs from the influenza virus (flu virus) can last 14-18 days and are typically more severe. Symptoms may include:   Runny or stuffy (congested) nose.   Sneezing.   Cough.   Sore throat.   Headache.   Fatigue.   Fever.   Loss of appetite.   Pain in your forehead, behind your eyes, and over your cheekbones (sinus pain).  Muscle aches.  DIAGNOSIS  Your health care provider may diagnose a URI by:  Physical exam.  Tests to check that your symptoms are not due to  another condition such as:  Strep throat.  Sinusitis.  Pneumonia.  Asthma. TREATMENT  A URI goes away on its own with time. It cannot be cured with medicines, but medicines may be prescribed or recommended to relieve symptoms. Medicines may help:  Reduce your fever.  Reduce your cough.  Relieve nasal congestion. HOME CARE INSTRUCTIONS   Take medicines only as directed by your health care provider.   Gargle warm saltwater or take cough drops to comfort your throat as directed by your health care provider.  Use a warm mist humidifier or inhale steam from a shower to increase air moisture. This may make it easier to breathe.  Drink enough fluid to keep your urine clear or pale yellow.   Eat soups and other clear broths and maintain good nutrition.   Rest as needed.   Return to work when your temperature has returned to normal or as your health care provider advises. You may need to stay home longer to avoid infecting others. You can also use a face mask and careful hand washing to prevent spread of the virus.  Increase the usage of your inhaler if you have asthma.   Do not use any tobacco products, including cigarettes, chewing tobacco, or electronic cigarettes. If you need help quitting, ask your health care provider. PREVENTION  The best way to protect yourself from getting a cold is to practice good hygiene.   Avoid oral or hand contact with people with cold   symptoms.   Wash your hands often if contact occurs.  There is no clear evidence that vitamin C, vitamin E, echinacea, or exercise reduces the chance of developing a cold. However, it is always recommended to get plenty of rest, exercise, and practice good nutrition.  SEEK MEDICAL CARE IF:   You are getting worse rather than better.   Your symptoms are not controlled by medicine.   You have chills.  You have worsening shortness of breath.  You have brown or red mucus.  You have yellow or brown nasal  discharge.  You have pain in your face, especially when you bend forward.  You have a fever.  You have swollen neck glands.  You have pain while swallowing.  You have white areas in the back of your throat. SEEK IMMEDIATE MEDICAL CARE IF:   You have severe or persistent:  Headache.  Ear pain.  Sinus pain.  Chest pain.  You have chronic lung disease and any of the following:  Wheezing.  Prolonged cough.  Coughing up blood.  A change in your usual mucus.  You have a stiff neck.  You have changes in your:  Vision.  Hearing.  Thinking.  Mood. MAKE SURE YOU:   Understand these instructions.  Will watch your condition.  Will get help right away if you are not doing well or get worse.   This information is not intended to replace advice given to you by your health care provider. Make sure you discuss any questions you have with your health care provider.   Document Released: 09/02/2000 Document Revised: 07/24/2014 Document Reviewed: 06/14/2013 Elsevier Interactive Patient Education 2016 [employer] Inc[employer]  

## 2015-02-20 NOTE — ED Notes (Signed)
Pt states she will follow up with her PCP for vaginal symptoms

## 2015-02-20 NOTE — ED Provider Notes (Signed)
CSN: [id number]     Arrival date & time 02/20/15  0906 History   First MD Initiated Contact with Patient 02/20/15 0920     Chief Complaint  Patient presents with  . Cough  . Vaginal Discharge     (Consider location/radiation/quality/duration/timing/severity/associated sxs/prior Treatment) Patient is a 29 y.o. female presenting with cough and vaginal discharge. The history is provided by the patient.  Cough Cough characteristics:  Dry and productive Sputum characteristics:  Nondescript Severity:  Moderate Onset quality:  Gradual Duration:  1 week Timing:  Constant Progression:  Unchanged Chronicity:  Recurrent Smoker: no   Context: sick contacts (person at work) and upper respiratory infection   Context: not smoke exposure   Relieved by:  Nothing Worsened by:  Nothing tried Ineffective treatments:  None tried Associated symptoms: fever (started 3 days ago) and myalgias   Associated symptoms: no chest pain   Vaginal Discharge Associated symptoms: fever (started 3 days ago)     Past Medical History  Diagnosis Date  . Hypertension   . Anemia   . Anxiety    Past Surgical History  Procedure Laterality Date  . Appendectomy     No family history on file. Social History  Substance Use Topics  . Smoking status: Never Smoker   . Smokeless tobacco: Never Used  . Alcohol Use: No   OB History    No data available     Review of Systems  Constitutional: Positive for fever (started 3 days ago).  Respiratory: Positive for cough.   Cardiovascular: Negative for chest pain.  Genitourinary: Positive for vaginal discharge.  Musculoskeletal: Positive for myalgias.  All other systems reviewed and are negative.     Allergies  Review of patient's allergies indicates no known allergies.  Home Medications   Prior to Admission medications   Medication Sig Start Date End Date Taking? Authorizing Provider  carvedilol (COREG) 3.125 MG tablet Take 3.125 mg by mouth 2 (two)  times daily with a meal.   Yes Historical Provider, MD  hydrochlorothiazide (MICROZIDE) 12.5 MG capsule Take 12.5 mg by mouth daily.   Yes Historical Provider, MD  acetaminophen (TYLENOL) 500 MG tablet Take 1,000 mg by mouth every 6 (six) hours as needed for mild pain, moderate pain, fever or headache.    Historical Provider, MD  diazepam (VALIUM) 2 MG tablet Take 1 tablet (2 mg total) by mouth every 4 (four) hours as needed for muscle spasms. 12/25/14   Lorre Nick, MD  ibuprofen (ADVIL,MOTRIN) 400 MG tablet Take 1 tablet (400 mg total) by mouth every 6 (six) hours as needed. 12/25/14   Lorre Nick, MD   BP 140/109 mmHg  Pulse 100  Temp(Src) 99.1 F (37.3 C) (Oral)  Ht  (1.651 m)  Wt 143 lb (64.864 kg)  BMI 23.80 kg/m2  SpO2 99%  LMP 01/05/2015 (Within Days) Physical Exam  Constitutional: She is oriented to person, place, and time. She appears well-developed and well-nourished. No distress.  HENT:  Head: Normocephalic.  Eyes: Conjunctivae are normal.  Neck: Neck supple. No tracheal deviation present.  Cardiovascular: Normal rate, regular rhythm and normal heart sounds.   Pulmonary/Chest: Effort normal and breath sounds normal. No respiratory distress. She has no decreased breath sounds. She has no wheezes.  Abdominal: Soft. She exhibits no distension.  Neurological: She is alert and oriented to person, place, and time.  Skin: Skin is warm and dry.  Psychiatric: She has a normal mood and affect.    ED Course  Procedures (  including critical care time) Labs Review Labs Reviewed  URINALYSIS, ROUTINE W REFLEX MICROSCOPIC (NOT AT [facility]) - Abnormal; Notable for the following:    APPearance CLOUDY (*)    All other components within normal limits  PREGNANCY, URINE    Imaging Review Dg Chest 2 View  02/20/2015  CLINICAL DATA:  Cough, fever. EXAM: CHEST  2 VIEW COMPARISON:  December 25, 2014. FINDINGS: The heart size and mediastinal contours are within normal limits. Both lungs  are clear. No pneumothorax or pleural effusion is noted. The visualized skeletal structures are unremarkable. IMPRESSION: No active cardiopulmonary disease. Electronically Signed   By: Lupita RaiderJames  Green Jr, M.D.   On: 02/20/2015 09:50   I have personally reviewed and evaluated these images and lab results as part of my medical decision-making.   EKG Interpretation None      MDM   Final diagnoses:  Viral URI with cough    29 year old female presents with typical upper respiratory symptoms over the last week with development of fevers the last 3 days intermittently. She states she had a fever of 103 last night. On arrival here she is afebrile at 6999 and is otherwise well-appearing with a dry cough. No clinical evidence of pneumonia. Given development of fevers after cough chest x-ray was ordered to evaluate for pneumonia no airspace disease was identified. Patient has no further complaints when asked, mentioned mild vaginal discharge to triage nurse but pregnancy test negative and not complaining of abdominal pain on review of systems so this can be followed up by the primary care physician.  Lyndal Pulleyaniel Troy Hartzog, MD 02/20/15 [phone]

## 2015-02-20 NOTE — ED Notes (Signed)
Pt also reports past treatment for BV- C/o pelvic pain and vaginal discharge today also

## 2015-02-20 NOTE — ED Notes (Signed)
Cough, body aches, low grade fever, sore throat x 1 week

## 2015-02-20 NOTE — ED Notes (Addendum)
Rx x 1 given for robitussin. Pt ambulatory at d/c. Note for work provided

## 2015-02-20 NOTE — ED Notes (Signed)
Patient transported to X-ray and returned 

## 2015-09-10 ENCOUNTER — Other Ambulatory Visit: Payer: Self-pay

## 2015-09-10 ENCOUNTER — Emergency Department (HOSPITAL_COMMUNITY)
Admission: EM | Admit: 2015-09-10 | Discharge: 2015-09-10 | Disposition: A | Discharge status: Home / Self Care | Payer: Self-pay | Attending: Emergency Medicine | Admitting: Emergency Medicine

## 2015-09-10 ENCOUNTER — Encounter (HOSPITAL_COMMUNITY): Payer: Self-pay

## 2015-09-10 ENCOUNTER — Emergency Department (HOSPITAL_COMMUNITY): Payer: Self-pay

## 2015-09-10 DIAGNOSIS — Z791 Long term (current) use of non-steroidal anti-inflammatories (NSAID): Secondary | ICD-10-CM | POA: Insufficient documentation

## 2015-09-10 DIAGNOSIS — R079 Chest pain, unspecified: Secondary | ICD-10-CM

## 2015-09-10 DIAGNOSIS — I10 Essential (primary) hypertension: Secondary | ICD-10-CM

## 2015-09-10 DIAGNOSIS — Z79899 Other long term (current) drug therapy: Secondary | ICD-10-CM | POA: Insufficient documentation

## 2015-09-10 LAB — BASIC METABOLIC PANEL WITH GFR
Anion gap: 5 (ref 5–15)
BUN: 13 mg/dL (ref 6–20)
CO2: 29 mmol/L (ref 22–32)
Calcium: 8.8 mg/dL — ABNORMAL LOW (ref 8.9–10.3)
Chloride: 102 mmol/L (ref 101–111)
Creatinine, Ser: 0.8 mg/dL (ref 0.44–1.00)
GFR calc Af Amer: >60 mL/min (ref >60)
GFR calc non Af Amer: >60 mL/min (ref >60)
Glucose, Bld: 87 mg/dL (ref 65–99)
Potassium: 3.9 mmol/L (ref 3.5–5.1)
Sodium: 136 mmol/L (ref 135–145)

## 2015-09-10 LAB — CBC
HCT: 35.2 % — ABNORMAL LOW (ref 36.0–46.0)
Hemoglobin: 11.8 g/dL — ABNORMAL LOW (ref 12.0–15.0)
MCH: 26.8 pg (ref 26.0–34.0)
MCHC: 33.5 g/dL (ref 30.0–36.0)
MCV: 79.8 fL (ref 78.0–100.0)
Platelets: 342 K/uL (ref 150–400)
RBC: 4.41 MIL/uL (ref 3.87–5.11)
RDW: 13.8 % (ref 11.5–15.5)
WBC: 7.3 K/uL (ref 4.0–10.5)

## 2015-09-10 LAB — I-STAT TROPONIN, ED: Troponin i, poc: 0 ng/mL (ref 0.00–0.08)

## 2015-09-10 NOTE — Discharge Instructions (Signed)
Please read and follow all provided instructions.  Your diagnoses today include:  1. Chest pain, unspecified chest pain type    Tests performed today include:  An EKG of your heart  A chest x-ray  Cardiac enzymes - a blood test for heart muscle damage  Blood counts and electrolytes  Vital signs. See below for your results today.   Medications prescribed:   Take any prescribed medications only as directed.  Follow-up instructions: Please follow-up with your primary care provider as soon as you can for further evaluation of your symptoms.   Return instructions:  SEEK IMMEDIATE MEDICAL ATTENTION IF:  You have severe chest pain, especially if the pain is crushing or pressure-like and spreads to the arms, back, neck, or jaw, or if you have sweating, nausea (feeling sick to your stomach), or shortness of breath. THIS IS AN EMERGENCY. Don't wait to see if the pain will go away. Get medical help at once. Call 911 or 0 (operator). DO NOT drive yourself to the hospital.   Your chest pain gets worse and does not go away with rest.   You have an attack of chest pain lasting longer than usual, despite rest and treatment with the medications your caregiver has prescribed.   You wake from sleep with chest pain or shortness of breath.  You feel dizzy or faint.  You have chest pain not typical of your usual pain for which you originally saw your caregiver.   You have any other emergent concerns regarding your health.  Additional Information: Chest pain comes from many different causes. Your caregiver has diagnosed you as having chest pain that is not specific for one problem, but does not require admission.  You are at low risk for an acute heart condition or other serious illness.   Your vital signs today were: BP 147/119 mmHg   Pulse 71   Temp(Src) 98.1 F (36.7 C) (Oral)   Resp 18   SpO2 100%   LMP 08/23/2015 If your blood pressure (BP) was elevated above 135/85 this visit, please  have this repeated by your doctor within one month. --------------

## 2015-09-10 NOTE — Progress Notes (Signed)
Dorthey SawyerKristina Price is pcp at [facility]  EPIC updated

## 2015-09-10 NOTE — ED Notes (Signed)
Pt with high blood pressure, dizziness, headache, chest pain.  Pt states she has had for "a while".  Pt has been evaluated by her primary MD and has visit for tomorrow.  States chest and head hurt enough to come here.

## 2015-09-10 NOTE — ED Provider Notes (Signed)
CSN: [id number]     Arrival date & time 09/10/15  1235 History   First MD Initiated Contact with Patient 09/10/15 1343     Chief Complaint  Patient presents with  . Chest Pain  . Headache   (Consider location/radiation/quality/duration/timing/severity/associated sxs/prior Treatment) HPI 30 y.o. female with a hx of HTN, Anxiety, presents to the Emergency Department today complaining of chest pain x 2 weeks. Noted nausea today at work while at lunch and decided to come in. Nausea occurred before PO intake. States CP has been ongoing x 2 weeks constant with no worsening factors. States pain is 7/10. Takes ibuprofen with minimal relief. No V/D. No diaphoresis. No headache. No fevers. No vision changes. Pt takes Carvedilol and HCTZ. Has appointment with PCP tomorrow. Of note, pt does endorse increase in anxiety after friend passed away recently. No other symptoms noted.    Past Medical History  Diagnosis Date  . Hypertension   . Anemia   . Anxiety    Past Surgical History  Procedure Laterality Date  . Appendectomy     History reviewed. No pertinent family history. Social History  Substance Use Topics  . Smoking status: Never Smoker   . Smokeless tobacco: Never Used  . Alcohol Use: No   OB History    No data available     Review of Systems ROS reviewed and all are negative for acute change except as noted in the HPI.  Allergies  Review of patient's allergies indicates no known allergies.  Home Medications   Prior to Admission medications   Medication Sig Start Date End Date Taking? Authorizing Provider  carvedilol (COREG) 3.125 MG tablet Take 3.125 mg by mouth 2 (two) times daily with a meal.   Yes Historical Provider, MD  hydrochlorothiazide (MICROZIDE) 12.5 MG capsule Take 12.5 mg by mouth daily.   Yes Historical Provider, MD  ibuprofen (ADVIL,MOTRIN) 200 MG tablet Take 800 mg by mouth every 6 (six) hours as needed for moderate pain.   Yes Historical Provider, MD  Multiple  Vitamin (MULTIVITAMIN WITH MINERALS) TABS tablet Take 2 tablets by mouth daily.   Yes Historical Provider, MD   BP 147/119 mmHg  Pulse 71  Temp(Src) 98.1 F (36.7 C) (Oral)  Resp 18  SpO2 100%  LMP 08/23/2015   Physical Exam  Constitutional: She is oriented to person, place, and time. She appears well-developed and well-nourished.  HENT:  Head: Normocephalic and atraumatic.  Eyes: EOM are normal. Pupils are equal, round, and reactive to light.  Neck: Normal range of motion. Neck supple. No tracheal deviation present.  Cardiovascular: Normal rate, regular rhythm, normal heart sounds and intact distal pulses.   No murmur heard. Pulmonary/Chest: Effort normal and breath sounds normal. No respiratory distress. She has no wheezes. She has no rales. She exhibits tenderness.  Chest wall along sternum TTP. No deformities. No ecchymosis.   Abdominal: Soft. There is no tenderness.  Musculoskeletal: Normal range of motion.  Neurological: She is alert and oriented to person, place, and time.  Skin: Skin is warm and dry.  Psychiatric: She has a normal mood and affect. Her behavior is normal. Thought content normal.  Nursing note and vitals reviewed.  ED Course  Procedures (including critical care time) Labs Review Labs Reviewed  BASIC METABOLIC PANEL - Abnormal; Notable for the following:    Calcium 8.8 (*)    All other components within normal limits  CBC - Abnormal; Notable for the following:    Hemoglobin 11.8 (*)  HCT 35.2 (*)    All other components within normal limits  I-STAT TROPOININ, ED   Imaging Review Dg Chest 2 View  09/10/2015  CLINICAL DATA:  Chest pain and dizziness for several days. Hypertension. Headache. EXAM: CHEST  2 VIEW COMPARISON:  02/20/2015 FINDINGS: The heart size and mediastinal contours are within normal limits. Both lungs are clear. The visualized skeletal structures are unremarkable. IMPRESSION: Negative.  No active cardiopulmonary disease. Electronically  Signed   By: Myles RosenthalJohn  Stahl M.D.   On: 09/10/2015 13:20   I have personally reviewed and evaluated these images and lab results as part of my medical decision-making.   EKG Interpretation   Date/Time:  Tuesday September 10 2015 13:00:20 EDT Ventricular Rate:  75 PR Interval:    QRS Duration: 90 QT Interval:  375 QTC Calculation: 419 R Axis:   22 Text Interpretation:  Sinus rhythm Borderline T abnormalities, inferior  leads No significant change since last tracing Confirmed by Ethelda ChickJACUBOWITZ   MD, SAM [phone]([id number]) on 09/10/2015 1:04:07 PM      MDM  I have reviewed and evaluated the relevant laboratory valuesI have reviewed and evaluated the relevant imaging studies. I have interpreted the relevant EKG.I have reviewed the relevant previous healthcare records.I obtained HPI from historian. Patient discussed with supervising physician  ED Course:  Assessment: Pt is a 30yF presents with CP x 2 weeks. Risk Factors HTN. No FH. No hx ACS. No hx DVT/PE. Patient is to be discharged with recommendation to follow up with PCP in regards to today's hospital visit. Chest pain is not likely of cardiac or pulmonary etiology d/t presentation, perc negative, VSS, no tracheal deviation, no JVD or new murmur, RRR, breath sounds equal bilaterally, EKG without acute abnormalities, negative troponin, and negative CXR. Heart Score 1. Pt has been advised start a PPI and return to the ED is CP becomes exertional, associated with diaphoresis or nausea, radiates to left jaw/arm, worsens or becomes concerning in any way. Pt does have appointment with PCP tomorrow for evaluation of HTN. Pt appears reliable for follow up and is agreeable to discharge. Patient is in no acute distress. Vital Signs are stable. Patient is able to ambulate. Patient able to tolerate PO.   Disposition/Plan:  DC Home Additional Verbal discharge instructions given and discussed with patient.  Pt Instructed to f/u with PCP tomorrow at scheduled appointment for  evaluation and treatment of symptoms. Return precautions given Pt acknowledges and agrees with plan  Supervising Physician Doug SouSam Jacubowitz, MD   Final diagnoses:  Chest pain, unspecified chest pain type  Essential hypertension      Felicia Piliyler Malerie Eakins, PA-C 09/10/15 1507  Doug SouSam Jacubowitz, MD 09/10/15 2242

## 2016-04-27 ENCOUNTER — Emergency Department (HOSPITAL_COMMUNITY)
Admission: EM | Admit: 2016-04-27 | Discharge: 2016-04-27 | Disposition: A | Discharge status: Home / Self Care | Payer: Self-pay | Attending: Emergency Medicine | Admitting: Emergency Medicine

## 2016-04-27 ENCOUNTER — Encounter (HOSPITAL_COMMUNITY): Payer: Self-pay | Admitting: Emergency Medicine

## 2016-04-27 ENCOUNTER — Emergency Department (HOSPITAL_COMMUNITY): Payer: Self-pay

## 2016-04-27 DIAGNOSIS — R519 Headache, unspecified: Secondary | ICD-10-CM

## 2016-04-27 DIAGNOSIS — Z79899 Other long term (current) drug therapy: Secondary | ICD-10-CM | POA: Insufficient documentation

## 2016-04-27 DIAGNOSIS — R791 Abnormal coagulation profile: Secondary | ICD-10-CM | POA: Insufficient documentation

## 2016-04-27 DIAGNOSIS — I10 Essential (primary) hypertension: Secondary | ICD-10-CM | POA: Insufficient documentation

## 2016-04-27 DIAGNOSIS — R51 Headache: Secondary | ICD-10-CM | POA: Insufficient documentation

## 2016-04-27 LAB — CBC
HCT: 32 % — ABNORMAL LOW (ref 36.0–46.0)
HEMOGLOBIN: 10.4 g/dL — AB (ref 12.0–15.0)
MCH: 25.2 pg — ABNORMAL LOW (ref 26.0–34.0)
MCHC: 32.5 g/dL (ref 30.0–36.0)
MCV: 77.5 fL — ABNORMAL LOW (ref 78.0–100.0)
PLATELETS: 329 10*3/uL (ref 150–400)
RBC: 4.13 MIL/uL (ref 3.87–5.11)
RDW: 14.5 % (ref 11.5–15.5)
WBC: 5.2 10*3/uL (ref 4.0–10.5)

## 2016-04-27 LAB — DIFFERENTIAL
BASOS ABS: 0 10*3/uL (ref 0.0–0.1)
Basophils Relative: 0 %
EOS ABS: 0.1 10*3/uL (ref 0.0–0.7)
Eosinophils Relative: 2 %
LYMPHS ABS: 1.4 10*3/uL (ref 0.7–4.0)
LYMPHS PCT: 27 %
Monocytes Absolute: 0.3 10*3/uL (ref 0.1–1.0)
Monocytes Relative: 6 %
NEUTROS PCT: 65 %
Neutro Abs: 3.3 10*3/uL (ref 1.7–7.7)

## 2016-04-27 LAB — COMPREHENSIVE METABOLIC PANEL
ALBUMIN: 4.1 g/dL (ref 3.5–5.0)
ALK PHOS: 46 U/L (ref 38–126)
ALT: 8 U/L — ABNORMAL LOW (ref 14–54)
AST: 17 U/L (ref 15–41)
Anion gap: 5 (ref 5–15)
BILIRUBIN TOTAL: 0.3 mg/dL (ref 0.3–1.2)
BUN: 9 mg/dL (ref 6–20)
CO2: 27 mmol/L (ref 22–32)
Calcium: 9 mg/dL (ref 8.9–10.3)
Chloride: 104 mmol/L (ref 101–111)
Creatinine, Ser: 0.76 mg/dL (ref 0.44–1.00)
GFR calc Af Amer: >60 mL/min (ref >60)
GFR calc non Af Amer: >60 mL/min (ref >60)
Glucose, Bld: 70 mg/dL (ref 65–99)
POTASSIUM: 3.6 mmol/L (ref 3.5–5.1)
SODIUM: 136 mmol/L (ref 135–145)
TOTAL PROTEIN: 6.9 g/dL (ref 6.5–8.1)

## 2016-04-27 LAB — I-STAT CHEM 8, ED
BUN: 7 mg/dL (ref 6–20)
CALCIUM ION: 1.24 mmol/L (ref 1.15–1.40)
Chloride: 101 mmol/L (ref 101–111)
Creatinine, Ser: 0.8 mg/dL (ref 0.44–1.00)
Glucose, Bld: 72 mg/dL (ref 65–99)
HCT: 35 % — ABNORMAL LOW (ref 36.0–46.0)
Hemoglobin: 11.9 g/dL — ABNORMAL LOW (ref 12.0–15.0)
POTASSIUM: 3.6 mmol/L (ref 3.5–5.1)
SODIUM: 139 mmol/L (ref 135–145)
TCO2: 26 mmol/L (ref 0–100)

## 2016-04-27 LAB — CBG MONITORING, ED: GLUCOSE-CAPILLARY: 80 mg/dL (ref 65–99)

## 2016-04-27 LAB — I-STAT TROPONIN, ED: Troponin i, poc: 0 ng/mL (ref 0.00–0.08)

## 2016-04-27 LAB — APTT: APTT: 28 s (ref 24–36)

## 2016-04-27 LAB — PROTIME-INR
INR: 1.03
Prothrombin Time: 13.5 seconds (ref 11.4–15.2)

## 2016-04-27 MED ORDER — DIPHENHYDRAMINE HCL 50 MG/ML IJ SOLN
25.0000 mg | Freq: Once | INTRAMUSCULAR | Status: AC
  Administered 2016-04-27: 25 mg via INTRAVENOUS
  Filled 2016-04-27: qty 1

## 2016-04-27 MED ORDER — DEXAMETHASONE SODIUM PHOSPHATE 10 MG/ML IJ SOLN
10.0000 mg | Freq: Once | INTRAMUSCULAR | Status: AC
  Administered 2016-04-27: 10 mg via INTRAVENOUS
  Filled 2016-04-27: qty 1

## 2016-04-27 MED ORDER — HYDROCHLOROTHIAZIDE 12.5 MG PO CAPS
12.5000 mg | ORAL_CAPSULE | Freq: Once | ORAL | Status: AC
  Administered 2016-04-27: 12.5 mg via ORAL
  Filled 2016-04-27: qty 1

## 2016-04-27 MED ORDER — HYDROCHLOROTHIAZIDE 12.5 MG PO CAPS: 12.5000 mg | ORAL_CAPSULE | Freq: Every day | ORAL | 0 refills | Status: DC

## 2016-04-27 MED ORDER — CARVEDILOL 3.125 MG PO TABS
3.1250 mg | ORAL_TABLET | Freq: Two times a day (BID) | ORAL | Status: DC
  Administered 2016-04-27: 3.125 mg via ORAL
  Filled 2016-04-27: qty 1

## 2016-04-27 MED ORDER — METOCLOPRAMIDE HCL 5 MG/ML IJ SOLN
10.0000 mg | Freq: Once | INTRAMUSCULAR | Status: AC
  Administered 2016-04-27: 10 mg via INTRAVENOUS
  Filled 2016-04-27: qty 2

## 2016-04-27 MED ORDER — KETOROLAC TROMETHAMINE 30 MG/ML IJ SOLN
30.0000 mg | Freq: Once | INTRAMUSCULAR | Status: AC
  Administered 2016-04-27: 30 mg via INTRAVENOUS
  Filled 2016-04-27: qty 1

## 2016-04-27 MED ORDER — CARVEDILOL 3.125 MG PO TABS: 3.1250 mg | ORAL_TABLET | Freq: Two times a day (BID) | ORAL | 0 refills | Status: DC

## 2016-04-27 NOTE — ED Triage Notes (Signed)
Pt complaint of headache for 2 days; blurred vision noted with awakening this morning 0645; out of blood pressure medications for 2 weeks. Pt neurologically intact.

## 2016-04-27 NOTE — ED Notes (Signed)
Post triage pt verbal intermittent right chest sharpness upon awakening 0645 this morning; denies associated symptoms.

## 2016-04-27 NOTE — ED Provider Notes (Signed)
[facility]-EMERGENCY DEPT Provider Note   CSN: [id number] Arrival date & time: 04/27/16  [id number]     History   Chief Complaint Chief Complaint  Patient presents with  . Hypertension  . Headache  . Blurred Vision  . Chest Pain    HPI Felicia Gibson is a 31 y.o. female.  The history is provided by the patient.  Headache   This is a new problem. The current episode started 2 days ago. The problem occurs constantly. The problem has not changed since onset.The headache is associated with nothing. The pain is located in the bilateral region. The quality of the pain is described as dull. The pain is moderate. The pain does not radiate. Pertinent negatives include no syncope, no shortness of breath, no nausea and no vomiting. Associated symptoms comments: Blurry vision. She has tried nothing for the symptoms.    Past Medical History:  Diagnosis Date  . Anemia   . Anxiety   . Hypertension     Patient Active Problem List   Diagnosis Date Noted  . HEADACHE 05/08/2010  . CHICKENPOX, HX OF 05/08/2010  . ANEMIA-NOS 05/07/2010  . HYPERTENSION 05/07/2010    Past Surgical History:  Procedure Laterality Date  . APPENDECTOMY      OB History    No data available       Home Medications    Prior to Admission medications   Medication Sig Start Date End Date Taking? Authorizing Provider  carvedilol (COREG) 3.125 MG tablet Take 3.125 mg by mouth 2 (two) times daily with a meal.   Yes Historical Provider, MD  hydrochlorothiazide (MICROZIDE) 12.5 MG capsule Take 12.5 mg by mouth daily.   Yes Historical Provider, MD  ibuprofen (ADVIL,MOTRIN) 200 MG tablet Take 800 mg by mouth every 6 (six) hours as needed for moderate pain.   Yes Historical Provider, MD    Family History No family history on file.  Social History Social History  Substance Use Topics  . Smoking status: Never Smoker  . Smokeless tobacco: Never Used  . Alcohol use No     Allergies   Patient has no known  allergies.   Review of Systems Review of Systems  Respiratory: Negative for shortness of breath.   Cardiovascular: Negative for syncope.  Gastrointestinal: Negative for nausea and vomiting.  Neurological: Positive for headaches.  All other systems reviewed and are negative.    Physical Exam Updated Vital Signs BP (!) 176/129   Pulse 72   Temp 98.7 F (37.1 C)   Resp 16   Ht 5\' 5"  (1.651 m)   Wt 145 lb (65.8 kg)   LMP 04/20/2016   SpO2 100%   BMI 24.13 kg/m   Physical Exam  Constitutional: She is oriented to person, place, and time. She appears well-developed and well-nourished. No distress.  HENT:  Head: Normocephalic.  Nose: Nose normal.  Mouth/Throat: Oropharynx is clear and moist.  Eyes: Conjunctivae are normal.  Fundoscopic exam:      The right eye shows no hemorrhage and no papilledema.       The left eye shows no hemorrhage and no papilledema.  Neck: Neck supple. No tracheal deviation present.  Cardiovascular: Normal rate and regular rhythm.   Pulmonary/Chest: Effort normal. No respiratory distress.  Abdominal: Soft. She exhibits no distension.  Neurological: She is alert and oriented to person, place, and time. She has normal strength. No cranial nerve deficit or sensory deficit. Coordination and gait normal. GCS eye subscore is 4. GCS verbal  subscore is 5. GCS motor subscore is 6.  Skin: Skin is warm and dry.  Psychiatric: She has a normal mood and affect.     ED Treatments / Results  Labs (all labs ordered are listed, but only abnormal results are displayed) Labs Reviewed  CBC - Abnormal; Notable for the following:       Result Value   Hemoglobin 10.4 (*)    HCT 32.0 (*)    MCV 77.5 (*)    MCH 25.2 (*)    All other components within normal limits  COMPREHENSIVE METABOLIC PANEL - Abnormal; Notable for the following:    ALT 8 (*)    All other components within normal limits  I-STAT CHEM 8, ED - Abnormal; Notable for the following:    Hemoglobin  11.9 (*)    HCT 35.0 (*)    All other components within normal limits  PROTIME-INR  APTT  DIFFERENTIAL  I-STAT TROPOININ, ED  CBG MONITORING, ED    EKG  EKG Interpretation  Date/Time:  Monday April 27 2016 10:18:43 EST Ventricular Rate:  81 PR Interval:    QRS Duration: 92 QT Interval:  379 QTC Calculation: 440 R Axis:   9 Text Interpretation:  Sinus rhythm Probable left atrial enlargement Nonspecific T abnormalities, inferior leads No significant change since last tracing Confirmed by Taleyah Hillman MD, Natane Heward ([id number]) on 04/27/2016 10:56:35 AM       Radiology Ct Head Wo Contrast  Result Date: 04/27/2016 CLINICAL DATA:  Headache for 2 days, blurred vision. EXAM: CT HEAD WITHOUT CONTRAST TECHNIQUE: Contiguous axial images were obtained from the base of the skull through the vertex without intravenous contrast. COMPARISON:  None. FINDINGS: Brain: No acute intracranial abnormality. Specifically, no hemorrhage, hydrocephalus, mass lesion, acute infarction, or significant intracranial injury. Vascular: No hyperdense vessel or unexpected calcification. Skull: No acute calvarial abnormality. Sinuses/Orbits: Visualized paranasal sinuses and mastoids clear. Orbital soft tissues unremarkable. Other: None IMPRESSION: No intracranial abnormality. Electronically Signed   By: Charlett Nose M.D.   On: 04/27/2016 10:54    Procedures Procedures (including critical care time)  Medications Ordered in ED Medications  metoCLOPramide (REGLAN) injection 10 mg (10 mg Intravenous Given 04/27/16 1210)  diphenhydrAMINE (BENADRYL) injection 25 mg (25 mg Intravenous Given 04/27/16 1213)  ketorolac (TORADOL) 30 MG/ML injection 30 mg (30 mg Intravenous Given 04/27/16 1212)  dexamethasone (DECADRON) injection 10 mg (10 mg Intravenous Given 04/27/16 1208)  hydrochlorothiazide (MICROZIDE) capsule 12.5 mg (12.5 mg Oral Given 04/27/16 1215)     Initial Impression / Assessment and Plan / ED Course  I have reviewed the triage  vital signs and the nursing notes.  Pertinent labs & imaging results that were available during my care of the patient were reviewed by me and considered in my medical decision making (see chart for details).     31 y.o. female presents with headache and blurry vision for last 2 days. She has been noncompliant with BP meds d/t running out and not having appointment. Atypical sharp chest pain this morning which resolved and low risk by heart score, troponin negative without ischemic findings on EKG, doubt ACS.BP is elevated, will restart on home antihypertensives and treat headache with migraine cocktail. No bleed or other acute findings on head CT from triage. Given 2 week supply of BP meds pending reassessment by PCP. Plan to follow up with PCP as needed and return precautions discussed for worsening or new concerning symptoms.   Final Clinical Impressions(s) / ED Diagnoses   Final diagnoses:  Chronic hypertension  Nonintractable headache, unspecified chronicity pattern, unspecified headache type    New Prescriptions Discharge Medication List as of 04/27/2016  1:07 PM       Lyndal Pulley, MD 04/27/16 [phone]

## 2016-04-27 NOTE — ED Notes (Signed)
[facility] aware of pt status and complaint.

## 2016-04-27 NOTE — ED Notes (Signed)
Patient reports that she has not taken BP meds for 1 1/2 weeks. [facility] [facility] [place] not [place] there for a recheck to g meds.

## 2016-06-10 ENCOUNTER — Emergency Department (HOSPITAL_COMMUNITY)
Admission: EM | Admit: 2016-06-10 | Discharge: 2016-06-10 | Disposition: A | Discharge status: Home / Self Care | Payer: Self-pay | Attending: Emergency Medicine | Admitting: Emergency Medicine

## 2016-06-10 DIAGNOSIS — N751 Abscess of Bartholin's gland: Secondary | ICD-10-CM | POA: Insufficient documentation

## 2016-06-10 DIAGNOSIS — Z79899 Other long term (current) drug therapy: Secondary | ICD-10-CM | POA: Insufficient documentation

## 2016-06-10 DIAGNOSIS — I10 Essential (primary) hypertension: Secondary | ICD-10-CM | POA: Insufficient documentation

## 2016-06-10 MED ORDER — CARVEDILOL 3.125 MG PO TABS: 3.1250 mg | ORAL_TABLET | Freq: Two times a day (BID) | ORAL | Status: DC

## 2016-06-10 MED ORDER — IBUPROFEN 800 MG PO TABS
800.0000 mg | ORAL_TABLET | Freq: Once | ORAL | Status: AC
  Administered 2016-06-10: 800 mg via ORAL
  Filled 2016-06-10: qty 1

## 2016-06-10 MED ORDER — IBUPROFEN 600 MG PO TABS: 600.0000 mg | ORAL_TABLET | Freq: Four times a day (QID) | ORAL | 0 refills | Status: AC | PRN

## 2016-06-10 MED ORDER — LIDOCAINE HCL 2 % IJ SOLN
10.0000 mL | Freq: Once | INTRAMUSCULAR | Status: AC
  Administered 2016-06-10: 200 mg
  Filled 2016-06-10: qty 20

## 2016-06-10 MED ORDER — METRONIDAZOLE 0.75 % VA GEL: 1.0000 | Freq: Two times a day (BID) | VAGINAL | 0 refills | Status: DC

## 2016-06-10 MED ORDER — CARVEDILOL 3.125 MG PO TABS
3.1250 mg | ORAL_TABLET | Freq: Two times a day (BID) | ORAL | Status: AC
  Administered 2016-06-10: 3.125 mg via ORAL
  Filled 2016-06-10: qty 1

## 2016-06-10 MED ORDER — CARVEDILOL 3.125 MG PO TABS: 3.1250 mg | ORAL_TABLET | Freq: Two times a day (BID) | ORAL | 1 refills | Status: DC

## 2016-06-10 MED ORDER — HYDROCHLOROTHIAZIDE 12.5 MG PO CAPS: 12.5000 mg | ORAL_CAPSULE | Freq: Every day | ORAL | 1 refills | Status: DC

## 2016-06-10 MED ORDER — SULFAMETHOXAZOLE-TRIMETHOPRIM 800-160 MG PO TABS: 1.0000 | ORAL_TABLET | Freq: Two times a day (BID) | ORAL | 0 refills | Status: AC

## 2016-06-10 MED ORDER — SULFAMETHOXAZOLE-TRIMETHOPRIM 800-160 MG PO TABS
1.0000 | ORAL_TABLET | Freq: Once | ORAL | Status: AC
  Administered 2016-06-10: 1 via ORAL
  Filled 2016-06-10: qty 1

## 2016-06-10 MED ORDER — HYDROCODONE-ACETAMINOPHEN 5-325 MG PO TABS: 1.0000 | ORAL_TABLET | Freq: Four times a day (QID) | ORAL | 0 refills | Status: DC | PRN

## 2016-06-10 NOTE — Discharge Instructions (Signed)
We recommend frequent warm soaks or compresses to promote drainage. Take Bactrim to help fight infection. You may take ibuprofen as prescribed for pain. If your pain persists despite ibuprofen, you may take Norco as prescribed. Follow-up with your OB/GYN to ensure resolution of your abscess.  You have also been prescribed your blood pressure medication. Take this as prescribed and follow up with your primary care doctor for a recheck of your blood pressure.  You may return to the emergency department, as needed, for new or concerning symptoms.

## 2016-06-10 NOTE — ED Notes (Signed)
Pt reports pain to left labia that has been enlarging since 06/05/16. 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

## 2016-06-10 NOTE — ED Provider Notes (Signed)
[facility]-EMER[facility] DEPT Provider Note   CSN: [id number] Arrival date & time: 06/10/16  1927    History   Chief Complaint Chief Complaint  Patient presents with  . Groin Swelling  . Medication Refill    HPI Felicia Gibson is a 31 y.o. female.  31 year old female with a history of hypertension and anxiety presents to the emergency department for complaints of left labial swelling. Patient reports a history of Bartholin's abscess. She believes that her symptoms today are similar. She has had progressive swelling over the past 5 days. She reports no relief with Epsom salt soaks and warm compresses. No medications taken prior to arrival for symptoms. Patient complains of some subjective chills with no reported fever. No vomiting or bowel changes. Patient reports starting her menstrual cycle yesterday. As an aside, she has been out of her blood pressure medication since 04/27/2016. She states that she has been unable to follow up with her primary care doctor for a refill of these prescriptions.      Past Medical History:  Diagnosis Date  . Anemia   . Anxiety   . Hypertension     Patient Active Problem List   Diagnosis Date Noted  . HEADACHE 05/08/2010  . CHICKENPOX, HX OF 05/08/2010  . ANEMIA-NOS 05/07/2010  . HYPERTENSION 05/07/2010    Past Surgical History:  Procedure Laterality Date  . APPENDECTOMY      OB History    No data available       Home Medications    Prior to Admission medications   Medication Sig Start Date End Date Taking? Authorizing Provider  naproxen sodium (ANAPROX) 220 MG tablet Take 220 mg by mouth as needed (pain).   Yes Historical Provider, MD  carvedilol (COREG) 3.125 MG tablet Take 1 tablet (3.125 mg total) by mouth 2 (two) times daily with a meal. 06/10/16 06/24/16  Antony Madura, PA-C  hydrochlorothiazide (MICROZIDE) 12.5 MG capsule Take 1 capsule (12.5 mg total) by mouth daily. 06/10/16 06/24/16  Antony Madura, PA-C  HYDROcodone-acetaminophen  (NORCO/VICODIN) 5-325 MG tablet Take 1 tablet by mouth every 6 (six) hours as needed for severe pain. 06/10/16   Antony Madura, PA-C  ibuprofen (ADVIL,MOTRIN) 600 MG tablet Take 1 tablet (600 mg total) by mouth every 6 (six) hours as needed. 06/10/16   Antony Madura, PA-C  metroNIDAZOLE (METROGEL VAGINAL) 0.75 % vaginal gel Place 1 Applicatorful vaginally 2 (two) times daily. For 1 week for bacterial vaginosis 06/10/16   Antony Madura, PA-C  sulfamethoxazole-trimethoprim (BACTRIM DS,SEPTRA DS) 800-160 MG tablet Take 1 tablet by mouth 2 (two) times daily. 06/10/16 06/17/16  Antony Madura, PA-C    Family History No family history on file.  Social History Social History  Substance Use Topics  . Smoking status: Never Smoker  . Smokeless tobacco: Never Used  . Alcohol use No     Allergies   Patient has no known allergies.   Review of Systems Review of Systems Ten systems reviewed and are negative for acute change, except as noted in the HPI.    Physical Exam Updated Vital Signs BP (!) 151/118 (BP Location: Left Arm)   Pulse 98   Temp 99.1 F (37.3 C) (Oral)   Resp 16   Ht 5\' 5"  (1.651 m)   Wt 64.9 kg   LMP 06/09/2016   SpO2 100%   BMI 23.80 kg/m   Physical Exam  Constitutional: She is oriented to person, place, and time. She appears well-developed and well-nourished. No distress.  Nontoxic appearing  and in NAD  HENT:  Head: Normocephalic and atraumatic.  Eyes: Conjunctivae and EOM are normal. No scleral icterus.  Neck: Normal range of motion.  Cardiovascular: Normal rate, regular rhythm and intact distal pulses.   Pulmonary/Chest: Effort normal. No respiratory distress.  Respirations even and unlabored  Genitourinary:    There is no rash, tenderness, lesion or injury on the right labia. There is tenderness on the left labia. There is bleeding (c/w menses) in the vagina.  Musculoskeletal: Normal range of motion.  Neurological: She is alert and oriented to person, place, and time.  She exhibits normal muscle tone. Coordination normal.  GCS 15. Patient moving all extremities.  Skin: Skin is warm and dry. No rash noted. She is not diaphoretic. No erythema. No pallor.  Psychiatric: She has a normal mood and affect. Her behavior is normal.  Nursing note and vitals reviewed.    ED Treatments / Results  Labs (all labs ordered are listed, but only abnormal results are displayed) Labs Reviewed - No data to display  EKG  EKG Interpretation None       Radiology No results found.  Procedures Procedures (including critical care time)  Medications Ordered in ED Medications  sulfamethoxazole-trimethoprim (BACTRIM DS,SEPTRA DS) 800-160 MG per tablet 1 tablet (not administered)  carvedilol (COREG) tablet 3.125 mg (3.125 mg Oral Given 06/10/16 2117)  lidocaine (XYLOCAINE) 2 % (with pres) injection 200 mg (200 mg Infiltration Given 06/10/16 2118)  ibuprofen (ADVIL,MOTRIN) tablet 800 mg (800 mg Oral Given 06/10/16 2210)    9:45 PM INCISION AND DRAINAGE Performed by: Antony Madura Consent: Verbal consent obtained. Risks and benefits: risks, benefits and alternatives were discussed Type: abscess  Body area: L labia  Anesthesia: local infiltration  Incision was made with a scalpel.  Local anesthetic: lidocaine 2% without epinephrine  Anesthetic total: 2.5 ml  Complexity: complex Blunt dissection to break up loculations  Drainage: purulent and bloody  Drainage amount: copious  Packing material: none  Patient tolerance: Patient tolerated the procedure well with no immediate complications.    Initial Impression / Assessment and Plan / ED Course  I have reviewed the triage vital signs and the nursing notes.  Pertinent labs & imaging results that were available during my care of the patient were reviewed by me and considered in my medical decision making (see chart for details).     Patient with bartholin's abscess x 5 days amenable to incision and  drainage. Wound recheck advised. I have encouraged home warm soaks and flushing. Given sensitive area of abscess, will start on Bactrim. Patient also advised to use NSAIDs for pain control. Patient also reporting noncompliance with antihypertensives. She reports running out of this medication. She is hemodynamically stable in the ED. First dose of carvedilol given and will refill HCTZ and Coreg for outpatient use. Patient has been told to follow-up with her primary care doctor in 1 week for a recheck of her blood pressure. Return precautions discussed and provided. Patient discharged in stable condition with no unaddressed concerns.   Final Clinical Impressions(s) / ED Diagnoses   Final diagnoses:  Bartholin's gland abscess  Hypertension not at goal    New Prescriptions New Prescriptions   HYDROCODONE-ACETAMINOPHEN (NORCO/VICODIN) 5-325 MG TABLET    Take 1 tablet by mouth every 6 (six) hours as needed for severe pain.   IBUPROFEN (ADVIL,MOTRIN) 600 MG TABLET    Take 1 tablet (600 mg total) by mouth every 6 (six) hours as needed.   METRONIDAZOLE (METROGEL VAGINAL) 0.75 %  VAGINAL GEL    Place 1 Applicatorful vaginally 2 (two) times daily. For 1 week for bacterial vaginosis   SULFAMETHOXAZOLE-TRIMETHOPRIM (BACTRIM DS,SEPTRA DS) 800-160 MG TABLET    Take 1 tablet by mouth 2 (two) times daily.     Antony MaduraKelly Bomani Oommen, PA-C 06/10/16 2245    Nira ConnPedro Eduardo Cardama, MD 06/11/16 [phone]

## 2016-06-10 NOTE — ED Triage Notes (Signed)
Pt states that she has a hx of bartholin's cysts and feels like she is getting another one. Also states she is out of her BP meds. Pt also thinks that she may have a repeat of her bacterial vaginosis. Alert and oriented.

## 2016-06-10 NOTE — ED Notes (Signed)
Pt also reports that she has been unable to get her blood pressure medications due to not being in close proximity to her PCP.

## 2017-04-14 ENCOUNTER — Ambulatory Visit (INDEPENDENT_AMBULATORY_CARE_PROVIDER_SITE_OTHER): Payer: 59 | Admitting: Obstetrics

## 2017-04-14 ENCOUNTER — Encounter: Payer: Self-pay | Admitting: Obstetrics

## 2017-04-14 VITALS — BP 144/105 | HR 90 | Ht 65.0 in | Wt 162.9 lb

## 2017-04-14 DIAGNOSIS — Z30011 Encounter for initial prescription of contraceptive pills: Secondary | ICD-10-CM

## 2017-04-14 DIAGNOSIS — Z01419 Encounter for gynecological examination (general) (routine) without abnormal findings: Secondary | ICD-10-CM | POA: Diagnosis not present

## 2017-04-14 DIAGNOSIS — Z1151 Encounter for screening for human papillomavirus (HPV): Secondary | ICD-10-CM | POA: Diagnosis not present

## 2017-04-14 DIAGNOSIS — Z113 Encounter for screening for infections with a predominantly sexual mode of transmission: Secondary | ICD-10-CM | POA: Diagnosis not present

## 2017-04-14 DIAGNOSIS — Z0000 Encounter for general adult medical examination without abnormal findings: Secondary | ICD-10-CM

## 2017-04-14 DIAGNOSIS — Z124 Encounter for screening for malignant neoplasm of cervix: Secondary | ICD-10-CM | POA: Diagnosis not present

## 2017-04-14 DIAGNOSIS — Z3009 Encounter for other general counseling and advice on contraception: Secondary | ICD-10-CM

## 2017-04-14 DIAGNOSIS — I10 Essential (primary) hypertension: Secondary | ICD-10-CM

## 2017-04-14 DIAGNOSIS — N898 Other specified noninflammatory disorders of vagina: Secondary | ICD-10-CM

## 2017-04-14 DIAGNOSIS — Z23 Encounter for immunization: Secondary | ICD-10-CM | POA: Diagnosis not present

## 2017-04-14 MED ORDER — CARVEDILOL 12.5 MG PO TABS: 12.5000 mg | ORAL_TABLET | Freq: Two times a day (BID) | ORAL | 4 refills | Status: DC

## 2017-04-14 MED ORDER — TRIAMTERENE-HCTZ 37.5-25 MG PO CAPS: 1.0000 | ORAL_CAPSULE | Freq: Every day | ORAL | 4 refills | Status: DC

## 2017-04-14 MED ORDER — NORETHIN-ETH ESTRAD-FE BIPHAS 1 MG-10 MCG / 10 MCG PO TABS: ORAL_TABLET | ORAL | 4 refills | Status: AC

## 2017-04-14 NOTE — Progress Notes (Signed)
Subjective:        Felicia Gibson is a 32 y.o. female here for a routine exam.  Current complaints: Vaginal irritation..    Personal health questionnaire:  Is patient Ashkenazi Jewish, have a family history of breast and/or ovarian cancer: no Is there a family history of uterine cancer diagnosed at age < 6550, gastrointestinal cancer, urinary tract cancer, family member who is a Personnel officerLynch syndrome-associated carrier: no Is the patient overweight and hypertensive, family history of diabetes, personal history of gestational diabetes, preeclampsia or PCOS: no Is patient over 6055, have PCOS,  family history of premature CHD under age 31, diabetes, smoke, have hypertension or peripheral artery disease:  no At any time, has a partner hit, kicked or otherwise hurt or frightened you?: no Over the past 2 weeks, have you felt down, depressed or hopeless?: no Over the past 2 weeks, have you felt little interest or pleasure in doing things?:no   Gynecologic History Patient's last menstrual period was 04/06/2017. Contraception: none Last Pap: unknown. Results were: normal Last mammogram: n/a. Results were: n/a  Obstetric History OB History  Gravida Para Term Preterm AB Living  1 1 1     1   SAB TAB Ectopic Multiple Live Births          1    # Outcome Date GA Lbr Len/2nd Weight Sex Delivery Anes PTL Lv  1 Term 03/04/08     Vag-Spont   LIV      Past Medical History:  Diagnosis Date  . Anemia   . Anxiety   . Hypertension     Past Surgical History:  Procedure Laterality Date  . APPENDECTOMY       Current Outpatient Medications:  .  ibuprofen (ADVIL,MOTRIN) 600 MG tablet, Take 1 tablet (600 mg total) by mouth every 6 (six) hours as needed., Disp: 30 tablet, Rfl: 0 .  carvedilol (COREG) 12.5 MG tablet, Take 1 tablet (12.5 mg total) by mouth 2 (two) times daily with a meal., Disp: 180 tablet, Rfl: 4 .  Norethindrone-Ethinyl Estradiol-Fe Biphas (LO LOESTRIN FE) 1 MG-10 MCG / 10 MCG tablet,  Take 1 tablet PO daily, starting on the 1st day of your period., Disp: 3 Package, Rfl: 4 .  triamterene-hydrochlorothiazide (DYAZIDE) 37.5-25 MG capsule, Take 1 each (1 capsule total) by mouth daily after breakfast., Disp: 90 capsule, Rfl: 4 No Known Allergies  Social History   Tobacco Use  . Smoking status: Never Smoker  . Smokeless tobacco: Never Used  Substance Use Topics  . Alcohol use: No    Family History  Problem Relation Age of Onset  . Diabetes Mother   . Hypertension Maternal Grandmother   . Stroke Maternal Grandmother   . Parkinson's disease Maternal Grandfather       Review of Systems  Constitutional: negative for fatigue and weight loss Respiratory: negative for cough and wheezing Cardiovascular: negative for chest pain, fatigue and palpitations Gastrointestinal: negative for abdominal pain and change in bowel habits Musculoskeletal:negative for myalgias Neurological: negative for gait problems and tremors Behavioral/Psych: negative for abusive relationship, depression Endocrine: negative for temperature intolerance    Genitourinary:negative for abnormal menstrual periods, genital lesions, hot flashes, sexual problems and vaginal discharge Integument/breast: negative for breast lump, breast tenderness, nipple discharge and skin lesion(s)    Objective:       BP (!) 144/105   Pulse 90   Ht 5\' 5"  (1.651 m)   Wt 162 lb 14.4 oz (73.9 kg)   LMP 04/06/2017  BMI 27.11 kg/m  General:   alert  Skin:   no rash or abnormalities  Lungs:   clear to auscultation bilaterally  Heart:   regular rate and rhythm, S1, S2 normal, no murmur, click, rub or gallop  Breasts:   normal without suspicious masses, skin or nipple changes or axillary nodes  Abdomen:  normal findings: no organomegaly, soft, non-tender and no hernia  Pelvis:  External genitalia: normal general appearance Urinary system: urethral meatus normal and bladder without fullness, nontender Vaginal: normal  without tenderness, induration or masses Cervix: normal appearance Adnexa: normal bimanual exam Uterus: anteverted and non-tender, normal size   Lab Review Urine pregnancy test Labs reviewed yes Radiologic studies reviewed no  50% of 20 min visit spent on counseling and coordination of care.   Assessment:     1. Encounter for routine gynecological examination with Papanicolaou smear of cervix Rx: - Cytology - PAP  2. Vaginal irritation Rx: - Cervicovaginal ancillary only  3. Screening for STD (sexually transmitted disease) Rx: - Hepatitis B surface antigen - RPR - Hepatitis C antibody - HIV antibody  4. HTN (hypertension), benign Rx: - carvedilol (COREG) 12.5 MG tablet; Take 1 tablet (12.5 mg total) by mouth 2 (two) times daily with a meal.  Dispense: 180 tablet; Refill: 4 - triamterene-hydrochlorothiazide (DYAZIDE) 37.5-25 MG capsule; Take 1 each (1 capsule total) by mouth daily after breakfast.  Dispense: 90 capsule; Refill: 4  5. Encounter for immunization Rx: - Tdap vaccine greater than or equal to 7yo IM  6. Encounter for other general counseling and advice on contraception - wants OCP's  7. Encounter for initial prescription of contraceptive pills Rx: - Norethindrone-Ethinyl Estradiol-Fe Biphas (LO LOESTRIN FE) 1 MG-10 MCG / 10 MCG tablet; Take 1 tablet PO daily, starting on the 1st day of your period.  Dispense: 3 Package; Refill: 4  8. Routine adult health maintenance Rx: - TSH - Comprehensive metabolic panel - CBC   Plan:    Education reviewed: calcium supplements, depression evaluation, low fat, low cholesterol diet, safe sex/STD prevention, self breast exams and weight bearing exercise. Contraception: OCP (estrogen/progesterone). Follow up in: 1 year.   Meds ordered this encounter  Medications  . carvedilol (COREG) 12.5 MG tablet    Sig: Take 1 tablet (12.5 mg total) by mouth 2 (two) times daily with a meal.    Dispense:  180 tablet    Refill:   4  . triamterene-hydrochlorothiazide (DYAZIDE) 37.5-25 MG capsule    Sig: Take 1 each (1 capsule total) by mouth daily after breakfast.    Dispense:  90 capsule    Refill:  4  . Norethindrone-Ethinyl Estradiol-Fe Biphas (LO LOESTRIN FE) 1 MG-10 MCG / 10 MCG tablet    Sig: Take 1 tablet PO daily, starting on the 1st day of your period.    Dispense:  3 Package    Refill:  4   Orders Placed This Encounter  Procedures  . Tdap vaccine greater than or equal to 7yo IM  . Hepatitis B surface antigen  . RPR  . Hepatitis C antibody  . TSH  . Comprehensive metabolic panel  . CBC  . HIV antibody    Brock Bad MD

## 2017-04-15 LAB — HIV ANTIBODY (ROUTINE TESTING W REFLEX): HIV SCREEN 4TH GENERATION: NONREACTIVE

## 2017-04-15 LAB — COMPREHENSIVE METABOLIC PANEL
ALBUMIN: 4.2 g/dL (ref 3.5–5.5)
ALK PHOS: 56 IU/L (ref 39–117)
ALT: 13 IU/L (ref 0–32)
AST: 18 IU/L (ref 0–40)
Albumin/Globulin Ratio: 1.4 (ref 1.2–2.2)
BILIRUBIN TOTAL: 0.3 mg/dL (ref 0.0–1.2)
BUN / CREAT RATIO: 9 (ref 9–23)
BUN: 8 mg/dL (ref 6–20)
CHLORIDE: 99 mmol/L (ref 96–106)
CO2: 27 mmol/L (ref 20–29)
Calcium: 9.1 mg/dL (ref 8.7–10.2)
Creatinine, Ser: 0.9 mg/dL (ref 0.57–1.00)
GFR calc Af Amer: 99 mL/min/{1.73_m2} (ref >59)
GFR calc non Af Amer: 86 mL/min/{1.73_m2} (ref >59)
GLUCOSE: 83 mg/dL (ref 65–99)
Globulin, Total: 2.9 g/dL (ref 1.5–4.5)
POTASSIUM: 4.4 mmol/L (ref 3.5–5.2)
Sodium: 140 mmol/L (ref 134–144)
Total Protein: 7.1 g/dL (ref 6.0–8.5)

## 2017-04-15 LAB — CERVICOVAGINAL ANCILLARY ONLY
Bacterial vaginitis: NEGATIVE
CHLAMYDIA, DNA PROBE: NEGATIVE
Candida vaginitis: NEGATIVE
Neisseria Gonorrhea: NEGATIVE
Trichomonas: NEGATIVE

## 2017-04-15 LAB — RPR: RPR: NONREACTIVE

## 2017-04-15 LAB — CBC
HEMATOCRIT: 33.9 % — AB (ref 34.0–46.6)
Hemoglobin: 10.9 g/dL — ABNORMAL LOW (ref 11.1–15.9)
MCH: 25.3 pg — ABNORMAL LOW (ref 26.6–33.0)
MCHC: 32.2 g/dL (ref 31.5–35.7)
MCV: 79 fL (ref 79–97)
Platelets: 404 10*3/uL — ABNORMAL HIGH (ref 150–379)
RBC: 4.31 x10E6/uL (ref 3.77–5.28)
RDW: 15.5 % — AB (ref 12.3–15.4)
WBC: 5.5 10*3/uL (ref 3.4–10.8)

## 2017-04-15 LAB — HEPATITIS B SURFACE ANTIGEN: HEP B S AG: NEGATIVE

## 2017-04-15 LAB — HEPATITIS C ANTIBODY: Hep C Virus Ab: <0.1 s/co ratio (ref 0.0–0.9)

## 2017-04-15 LAB — TSH: TSH: 2.06 u[IU]/mL (ref 0.450–4.500)

## 2017-04-16 LAB — CYTOLOGY - PAP
Diagnosis: NEGATIVE
HPV (WINDOPATH): NOT DETECTED

## 2017-12-30 ENCOUNTER — Other Ambulatory Visit: Payer: Self-pay | Admitting: Obstetrics

## 2017-12-30 DIAGNOSIS — I10 Essential (primary) hypertension: Secondary | ICD-10-CM

## 2017-12-31 ENCOUNTER — Other Ambulatory Visit: Payer: Self-pay | Admitting: Obstetrics

## 2018-01-09 ENCOUNTER — Other Ambulatory Visit: Payer: Self-pay | Admitting: Obstetrics

## 2018-01-09 DIAGNOSIS — I10 Essential (primary) hypertension: Secondary | ICD-10-CM

## 2018-11-05 IMAGING — CT CT HEAD W/O CM
3 of 4 series · 14 of 47 positions shown, 16 images · non-contrast
Comparison: None.

CLINICAL DATA: Headache for 2 days, blurred vision.

EXAM:
CT HEAD WITHOUT CONTRAST
TECHNIQUE: Contiguous axial images were obtained from the base of the skull
through the vertex without intravenous contrast.

[Series 2: head w/o · axial · non-contrast · 0.45mm/px · z∈[-125,-5]mm · 8 of 28 slices shown, 10 images]
[im 2/28  brain]
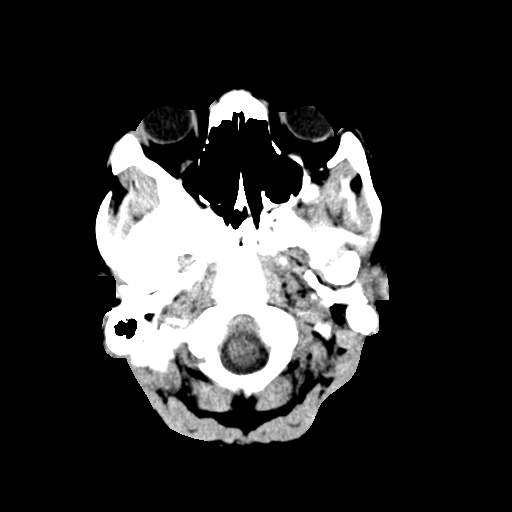
[im 2/28  bone]
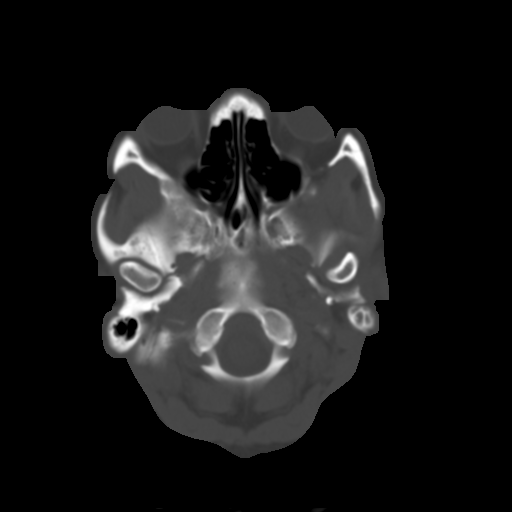
[im 6/28  brain]
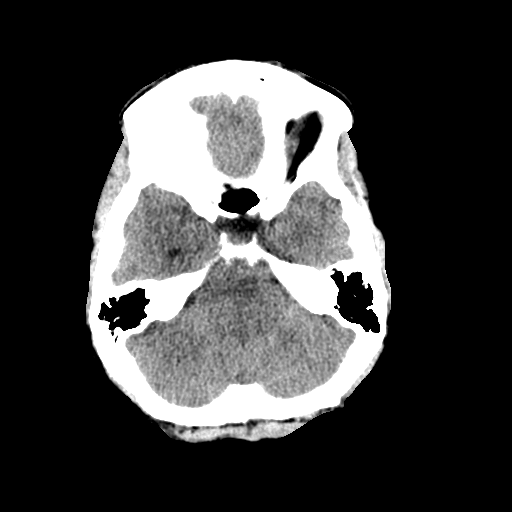
[im 10/28  brain]
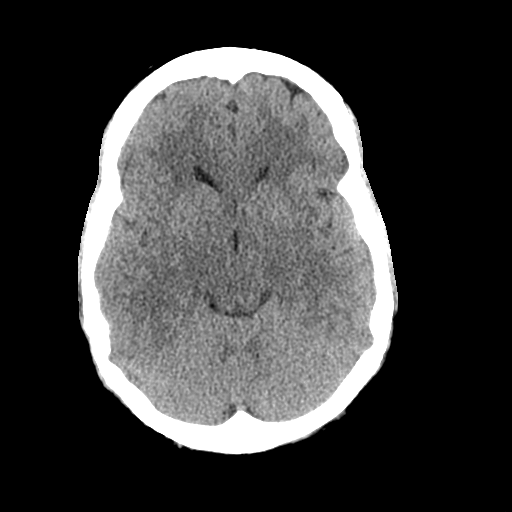
[im 12/28  brain]
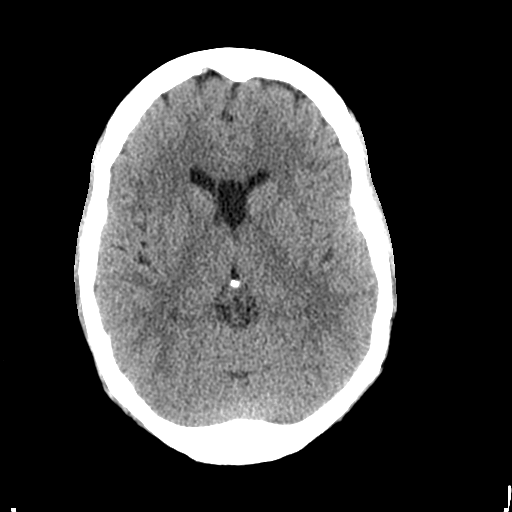
[im 16/28  brain]
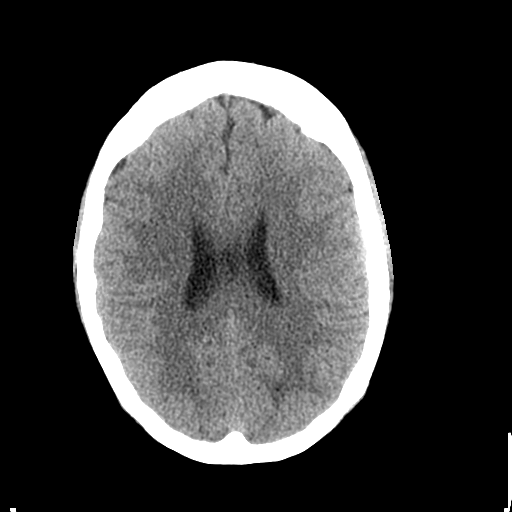
[im 16/28  bone]
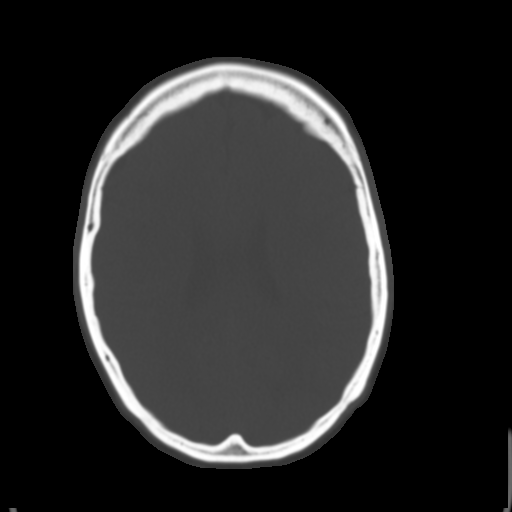
[im 18/28  brain]
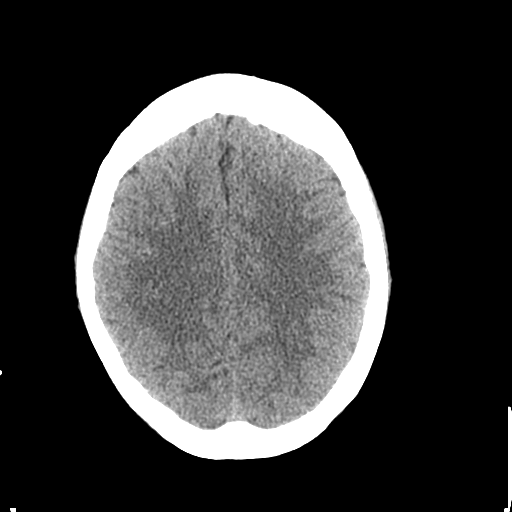
[im 22/28  brain]
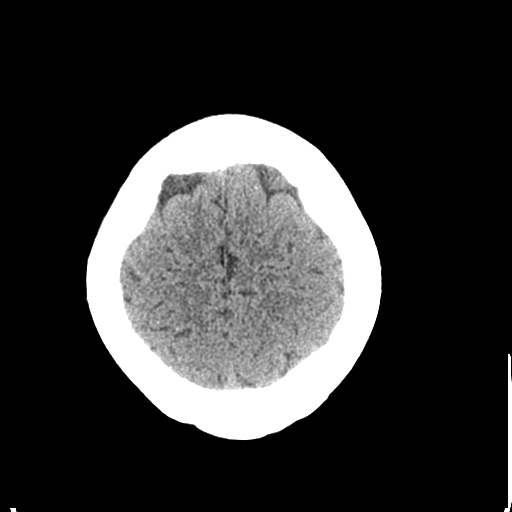
[im 26/28  brain]
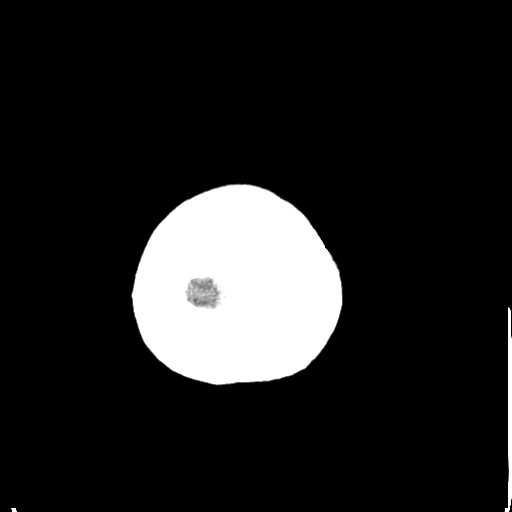

[Series 5: coronal · coronal · 0.27mm/px · 3 of 71 slices shown]
[im 24/71  brain]
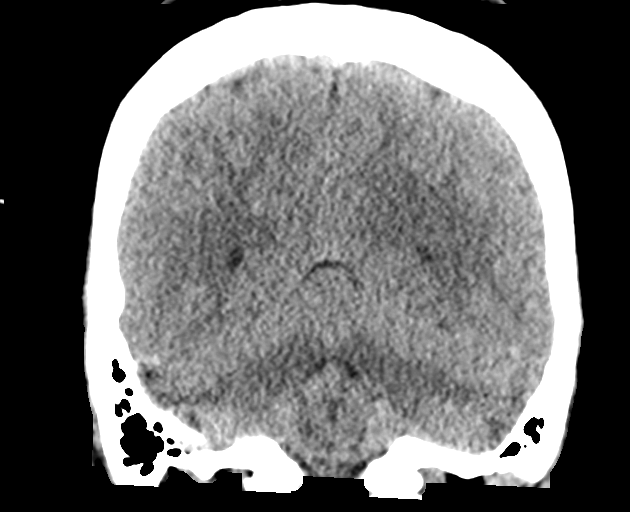
[im 32/71  brain]
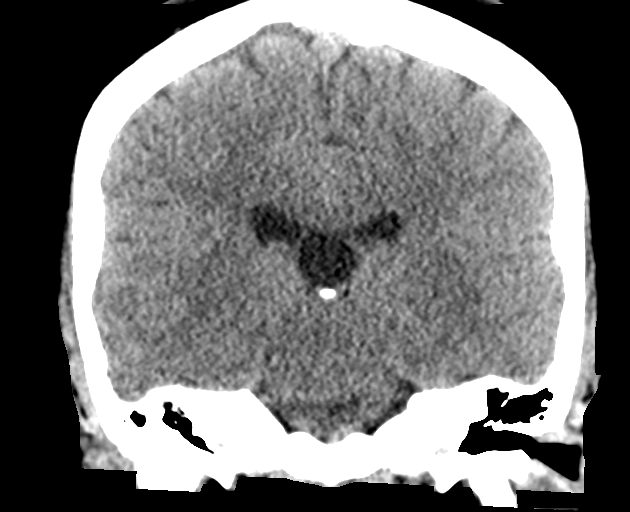
[im 39/71  brain]
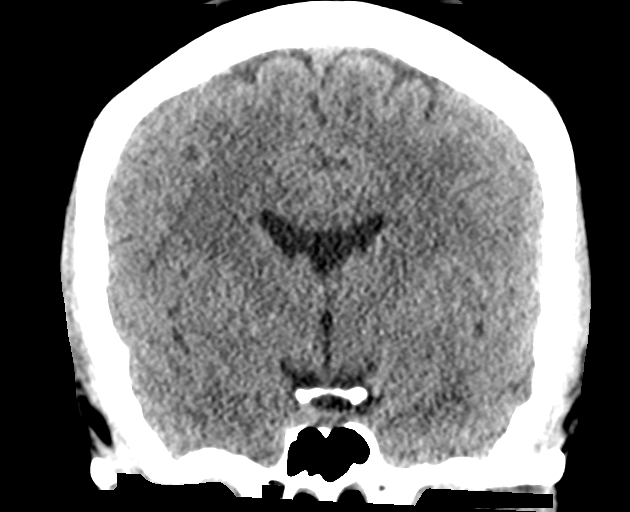

[Series 6: sagittal · sagittal · 0.27mm/px · 3 of 57 slices shown]
[im 19/57  brain]
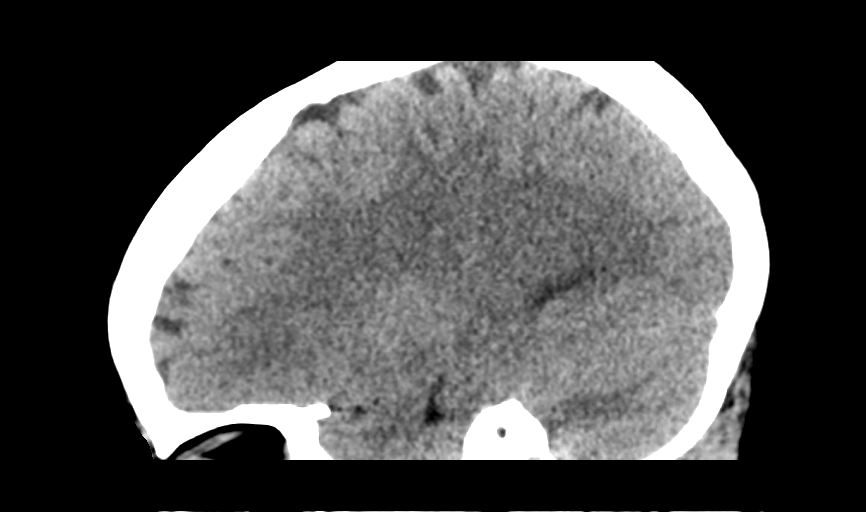
[im 29/57  brain]
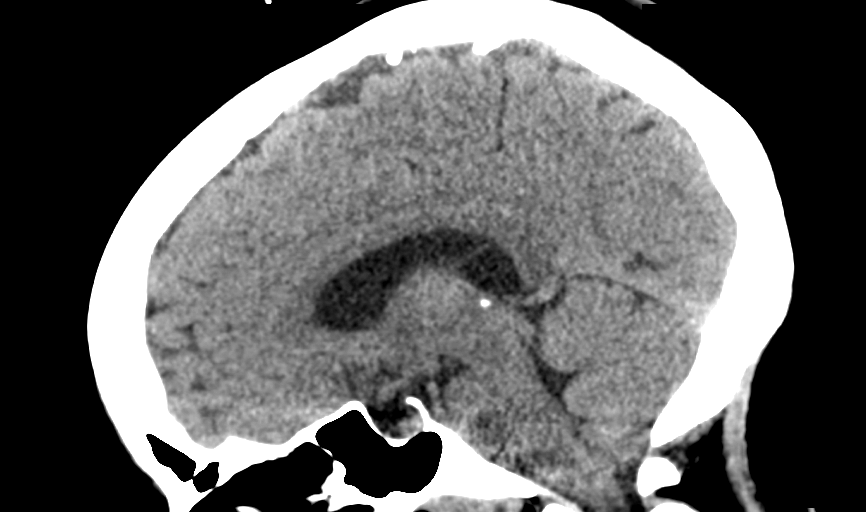
[im 38/57  brain]
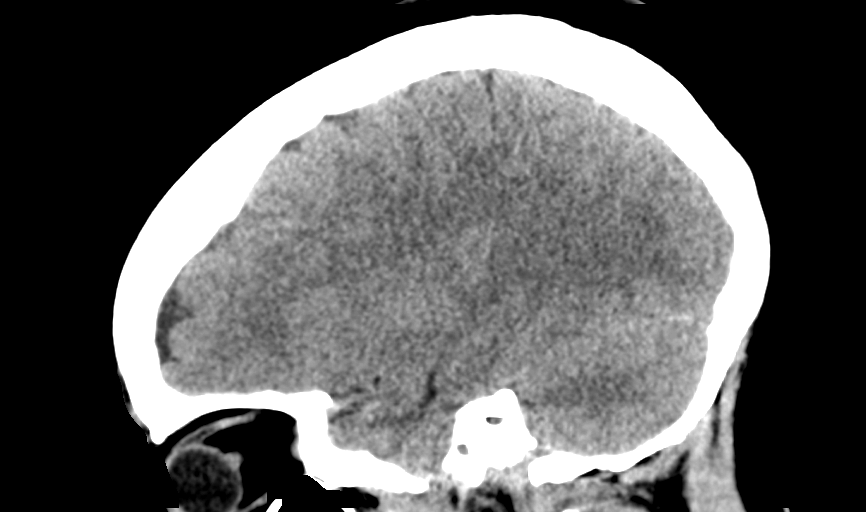

[14 of 47 positions shown; findings below may reference images not displayed]

FINDINGS: Brain: No acute intracranial abnormality. Specifically, no
hemorrhage, hydrocephalus, mass lesion, acute infarction, or
significant intracranial injury.

Vascular: No hyperdense vessel or unexpected calcification.

Skull: No acute calvarial abnormality.

Sinuses/Orbits: Visualized paranasal sinuses and mastoids clear.
Orbital soft tissues unremarkable.

Other: None
IMPRESSION: No intracranial abnormality.

## 2019-01-18 ENCOUNTER — Other Ambulatory Visit: Payer: Self-pay | Admitting: Obstetrics

## 2019-01-18 DIAGNOSIS — I10 Essential (primary) hypertension: Secondary | ICD-10-CM

## 2019-03-08 ENCOUNTER — Other Ambulatory Visit: Payer: Self-pay | Admitting: Obstetrics

## 2019-03-08 DIAGNOSIS — I10 Essential (primary) hypertension: Secondary | ICD-10-CM
